# Patient Record
Sex: Male | Born: 1980 | Race: White | Hispanic: No | Marital: Married | Smoking: Current every day smoker
Health system: Southern US, Community
[De-identification: ages and names within clinical notes are randomized; demographics above are authoritative.]

## PROBLEM LIST (undated history)

## (undated) DIAGNOSIS — M069 Rheumatoid arthritis, unspecified: Secondary | ICD-10-CM

## (undated) DIAGNOSIS — I10 Essential (primary) hypertension: Secondary | ICD-10-CM

## (undated) HISTORY — DX: Essential (primary) hypertension: I10

## (undated) HISTORY — PX: CYST EXCISION: SHX5701

---

## 2015-05-21 ENCOUNTER — Encounter (HOSPITAL_COMMUNITY): Payer: Self-pay | Admitting: *Deleted

## 2015-05-21 ENCOUNTER — Emergency Department (HOSPITAL_COMMUNITY)
Admission: EM | Admit: 2015-05-21 | Discharge: 2015-05-21 | Disposition: A | Discharge status: Home / Self Care | Payer: Self-pay | Attending: Emergency Medicine | Admitting: Emergency Medicine

## 2015-05-21 ENCOUNTER — Emergency Department (HOSPITAL_COMMUNITY): Payer: Self-pay

## 2015-05-21 DIAGNOSIS — Z720 Tobacco use: Secondary | ICD-10-CM | POA: Insufficient documentation

## 2015-05-21 DIAGNOSIS — Y998 Other external cause status: Secondary | ICD-10-CM | POA: Insufficient documentation

## 2015-05-21 DIAGNOSIS — Y9389 Activity, other specified: Secondary | ICD-10-CM | POA: Insufficient documentation

## 2015-05-21 DIAGNOSIS — F419 Anxiety disorder, unspecified: Secondary | ICD-10-CM | POA: Insufficient documentation

## 2015-05-21 DIAGNOSIS — Y9289 Other specified places as the place of occurrence of the external cause: Secondary | ICD-10-CM | POA: Insufficient documentation

## 2015-05-21 DIAGNOSIS — T401X1A Poisoning by heroin, accidental (unintentional), initial encounter: Secondary | ICD-10-CM | POA: Insufficient documentation

## 2015-05-21 MED ORDER — NALOXONE HCL 0.4 MG/ML IJ SOLN
0.4000 mg | Freq: Once | INTRAMUSCULAR | Status: AC
  Administered 2015-05-21: 0.4 mg via INTRAVENOUS
  Filled 2015-05-21: qty 1

## 2015-05-21 NOTE — ED Notes (Signed)
Pt awakens to verbal stimuli.  He denies pain at this time.  No acute distress.  RN offered toileting and pt denied the need to void.

## 2015-05-21 NOTE — ED Notes (Signed)
Pt is still sleeping with equal respirations and equal chest rise and fall/  No acute distress noted.

## 2015-05-21 NOTE — ED Provider Notes (Signed)
CSN: [id number]     Arrival date & time 05/21/15  1334 History   First MD Initiated Contact with Patient 05/21/15 1353     Chief Complaint  Patient presents with  . Drug Overdose    heroin     (Consider location/radiation/quality/duration/timing/severity/associated sxs/prior Treatment) HPI Comments: Patient presents to the emergency department after apparent heroin overdose. Patient was found unconscious at a gas station. Patient extremely somnolent, having difficulty staying awake at arrival. Level V Caveat due to somnolence.   History reviewed. No pertinent past medical history. History reviewed. No pertinent past surgical history. History reviewed. No pertinent family history. Social History  Substance Use Topics  . Smoking status: Smoker, Current Status Unknown  . Smokeless tobacco: None  . Alcohol Use: None    Review of Systems  Unable to perform ROS: Mental status change      Allergies  Review of patient's allergies indicates no known allergies.  Home Medications   Prior to Admission medications   Not on File   BP 95/42 mmHg  Pulse 104  Temp(Src) 99 F (37.2 C) (Oral)  Resp 10  SpO2 100% Physical Exam  Constitutional: He appears well-developed and well-nourished. He appears lethargic. No distress.  HENT:  Head: Normocephalic and atraumatic.  Right Ear: Hearing normal.  Left Ear: Hearing normal.  Nose: Nose normal.  Mouth/Throat: Oropharynx is clear and moist and mucous membranes are normal.  Eyes: Conjunctivae and EOM are normal. Pupils are equal, round, and reactive to light.  Neck: Normal range of motion. Neck supple.  Cardiovascular: Regular rhythm, S1 normal and S2 normal.  Exam reveals no gallop and no friction rub.   No murmur heard. Pulmonary/Chest: Effort normal and breath sounds normal. No respiratory distress. He exhibits no tenderness.  Abdominal: Soft. Normal appearance and bowel sounds are normal. There is no hepatosplenomegaly. There is no  tenderness. There is no rebound, no guarding, no tenderness at McBurney's point and negative Murphy's sign. No hernia.  Musculoskeletal: Normal range of motion.  Neurological: He has normal strength. He appears lethargic. No cranial nerve deficit or sensory deficit. Coordination normal.  Skin: Skin is warm, dry and intact. No rash noted. No cyanosis.  Psychiatric: His mood appears anxious.  Nursing note and vitals reviewed.   ED Course  Procedures (including critical care time) Labs Review Labs Reviewed - No data to display  Imaging Review Dg Chest [facility] 1 View  05/21/2015   CLINICAL DATA:  Heroin overdose  EXAM: PORTABLE CHEST - 1 VIEW  COMPARISON:  None.  FINDINGS: Lungs are clear. Heart size and pulmonary vascularity are normal. No adenopathy. No pneumothorax. No bone lesions.  IMPRESSION: No abnormality noted.   Electronically Signed   By: Bretta Bang III M.D.   On: 05/21/2015 14:34   I, POLLINA, CHRISTOPHER J., personally reviewed and evaluated these images and lab results as part of my medical decision-making.   EKG Interpretation   Date/Time:  Monday May 21 2015 14:09:26 EDT Ventricular Rate:  114 PR Interval:  137 QRS Duration: 94 QT Interval:  337 QTC Calculation: 464 R Axis:   78 Text Interpretation:  Age not entered, assumed to be  34 years old for  purpose of ECG interpretation Sinus tachycardia Otherwise within normal  limits Confirmed by POLLINA  MD, CHRISTOPHER [phone]) on 05/21/2015 2:59:44  PM      MDM   Final diagnoses:  Heroin overdose, accidental or unintentional, initial encounter    Patient presented to the ER very somnolent. Heroin  overdose was suspected. Patient had not been given any intervention by EMS. He was given Narcan 0.4 mg IV here in the ER became more awake and alert. He admits to using heroin earlier. Overdose was unintentional. Patient will be monitored here in the ER, anticipate discharge.    Gilda Crease, MD 05/21/15  [phone]

## 2015-05-21 NOTE — ED Notes (Signed)
Pt continues to sleep comfortably with equal chest rise and fall.  O2 saturation 99% on room air. No acute distress noted.

## 2015-05-21 NOTE — ED Notes (Signed)
Pt more awake.  Water given.  Pt states "he wants to get this stuff off of him and go home".

## 2015-05-21 NOTE — ED Notes (Signed)
Pt continues to sleep comfortably in bed.  Equal chest rise and fall noted.  No acute distress at this time.  O2 saturation 98% on room air.

## 2015-05-21 NOTE — ED Notes (Signed)
Pt found at gas station, responsive only to painful stimuli. RR 6 upon arrival

## 2015-05-21 NOTE — ED Notes (Signed)
Bed: WU98 Expected date:  Expected time:  Means of arrival:  Comments: EMS- 34yo M, heroin OD

## 2015-05-21 NOTE — Discharge Instructions (Signed)
Narcotic Overdose °A narcotic overdose is the misuse or overuse of a narcotic drug. A narcotic overdose can make you pass out and stop breathing. If you are not treated right away, this can cause permanent brain damage or stop your heart. Medicine may be given to reverse the effects of an overdose. If so, this medicine may bring on withdrawal symptoms. The symptoms may be abdominal cramps, throwing up (vomiting), sweating, chills, and nervousness. °Injecting narcotics can cause more problems than just an overdose. AIDS, hepatitis, and other very serious infections are transmitted by sharing needles and syringes. If you decide to quit using, there are medicines which can help you through the withdrawal period. Trying to quit all at once on your own can be uncomfortable, but not life-threatening. Call your caregiver, Narcotics Anonymous, or any drug and alcohol treatment program for further help.  °Document Released: 10/30/2004 Document Revised: 12/15/2011 Document Reviewed: 08/24/2009 °ExitCare® Patient Information ©2015 [employer]. This information is not intended to replace advice given to you by your health care provider. Make sure you discuss any questions you have with your health care provider. °Substance Abuse Treatment Programs ° °Intensive Outpatient Programs °[facility]     °[address]      °[place], [place]                   °[phone]      ° °[facility] °[address] °[place], [place] °[phone] ° °[facility]     °(Inpatient and outpatient)     °[address]           °[phone]   ° °[facility] °[phone] (Suboxone and Methadone) ° °[address]      °[place], [place] [address]      °[phone]      ° °[address] °[place], [place] °852-3033 ° °[facility] (Outpatient/Inpatient, Chemical)    °(insurance only) [phone]      °       °[facility] (Groups &  Residential) °[place], [place] °[phone] ° °   °[facility]     °[address]     °[place], [place]      °[phone]      ° °[facility] (for caregivers and family) °[address] °[place], [place] °336-299-4655 ° ° ° ° ° °Residential Treatment Programs °[facility]      °[address], [place], [place] [address]  °[phone]      ° °[facility] °[address] [place], [place] [address] °[phone] ° °[facility]        °336-248-8914      ° °[facility] °[phone] ° °[facility]             °[address]                                         °[place], [place]                                                °[phone] or [phone]                              ° °  [facility] [place] [address] [place], [address] [phone]  [facility]    [address]      [place], [place]     [phone]       [facility] [address] [place], [place] [phone]  [facility]   [address]     [place], [place] [address]     [phone]      Admissions: 8am-3pm M-F  [facility] ([facility]) [address] [place], [place] [phone]  [facility]: Residential Program [phone](90 Days)   [place], [place]      [phone] or [phone]     [facility]: [facility] [place], [place] (Walk in Hours over the weekend or by referral)  [facility] [address] [place], [place], [place] [address] [phone]  Crisis Mobile: [facility]:  [phone] (for crisis response 24 hours a day) [facility] Hotline:      [phone] Outpatient Psychiatry and Counseling  [facility]: Mobile Crisis Management 24 hours:  [phone]  [facility] sliding scale fee and walk in schedule: M-F 8am-12pm/1pm-3pm [address]  [place], [place]  [address] [phone]  [facility] [address] [place], [place] [address] [phone]  [facility] ([facility] new patient walk-in appointments available Monday - Friday 8am -3pm.          [address] [place], [place] [address] [phone] or crisis line- [phone]  [facility]/ Intensive Outpatient Therapy Program [address] [place], [place] [address] [phone]  [facility]                  Crisis Services      [phone]      [address]     [place], [place] [address]                 [facility]   [facility] [phone] [address] [place], [place] [address]   [facility]          [address] [place],  [place], [place] [address]       [phone]  [facility] [address] [place], [place] [address] [phone]  [facility]    [address]    [place], [place] [address]     [phone]       Andee PolesParish McKinney, MD     [address]     [place] [address]     [phone]       [facility] [address] [place] [address]  [facility]     [address]     [place], [place]      [phone]       [facility] Eulogio DitchShamsher Ahluwalia, MD [address] [place], [place] [address] [phone]  [facility]     [address]     [place], [place] [address]     [phone]       [facility] [address] [place], [place] [address] [phone] Resources for Temporary Residential Assistance/Crisis Centers  [facility]) M-F 8am-3pm   [address] [place], [place] [address]   [phone] Services include: laundry, barbering, support groups, case management, phone  &  computer access,  showers, AA/NA mtgs, mental health/substance abuse nurse, job skills class, disability information, VA assistance, spiritual classes, etc.   HOMELESS SHELTERS  [facility]     [facility]   [address], [place]     [phone]              [facility] [address] (women and children)       [address] [place], [place] [address] [phone] [email or web address] for application and process Application Required  [facility]   [address]    [place] [address]     [phone]                    [facility] [address] [place], [place] [address] [phone](schedule application appt.) Application Required  [facility] (women only)    [address]     [place], [place] [address]     [phone]      Intake starts 6pm daily Need valid ID, SSC, & Police report [facility] [place] [address] [place], [place] [phone] Application Required  [facility] (men only)     [address]      [place], [place]     [phone]       [facility] (Pregnant women only) [address] [place], [place] [phone]  [facility]      [address]      [place], [place] [address]     [phone]             [facility] [address] [place], [place] [phone] 90 day commitment/SA/Application process  [facility](men only)     [address]     [place], [place]     [phone]       Check-in at [facility]pm            [facility] [address] [place], [place] [address] [phone] Men/Women/Women and Children must be there by 7 pm  [facility] [place], [place] [phone]

## 2015-05-21 NOTE — ED Provider Notes (Signed)
11:32 PM Assumed care from Dr. Blinda Leatherwood, please see their note for full history, physical and decision making until this point. In brief this is a 34 y.o. year old male who presented to the ED tonight with Drug Overdose     Heroin overdose, allow to sober up for partially 8 hours. Patient subsequently with normal vital signs, alert and oriented and stable for discharge.  Discharge instructions, including strict return precautions for new or worsening symptoms, given. Patient and/or family verbalized understanding and agreement with the plan as described.    Marily Memos, MD 05/21/15 [phone]

## 2016-10-04 IMAGING — CR DG CHEST 1V PORT
1 series · 1 of 1 positions shown · non-contrast
Comparison: None.

CLINICAL DATA: Heroin overdose

EXAM:
PORTABLE CHEST - 1 VIEW

[AP]
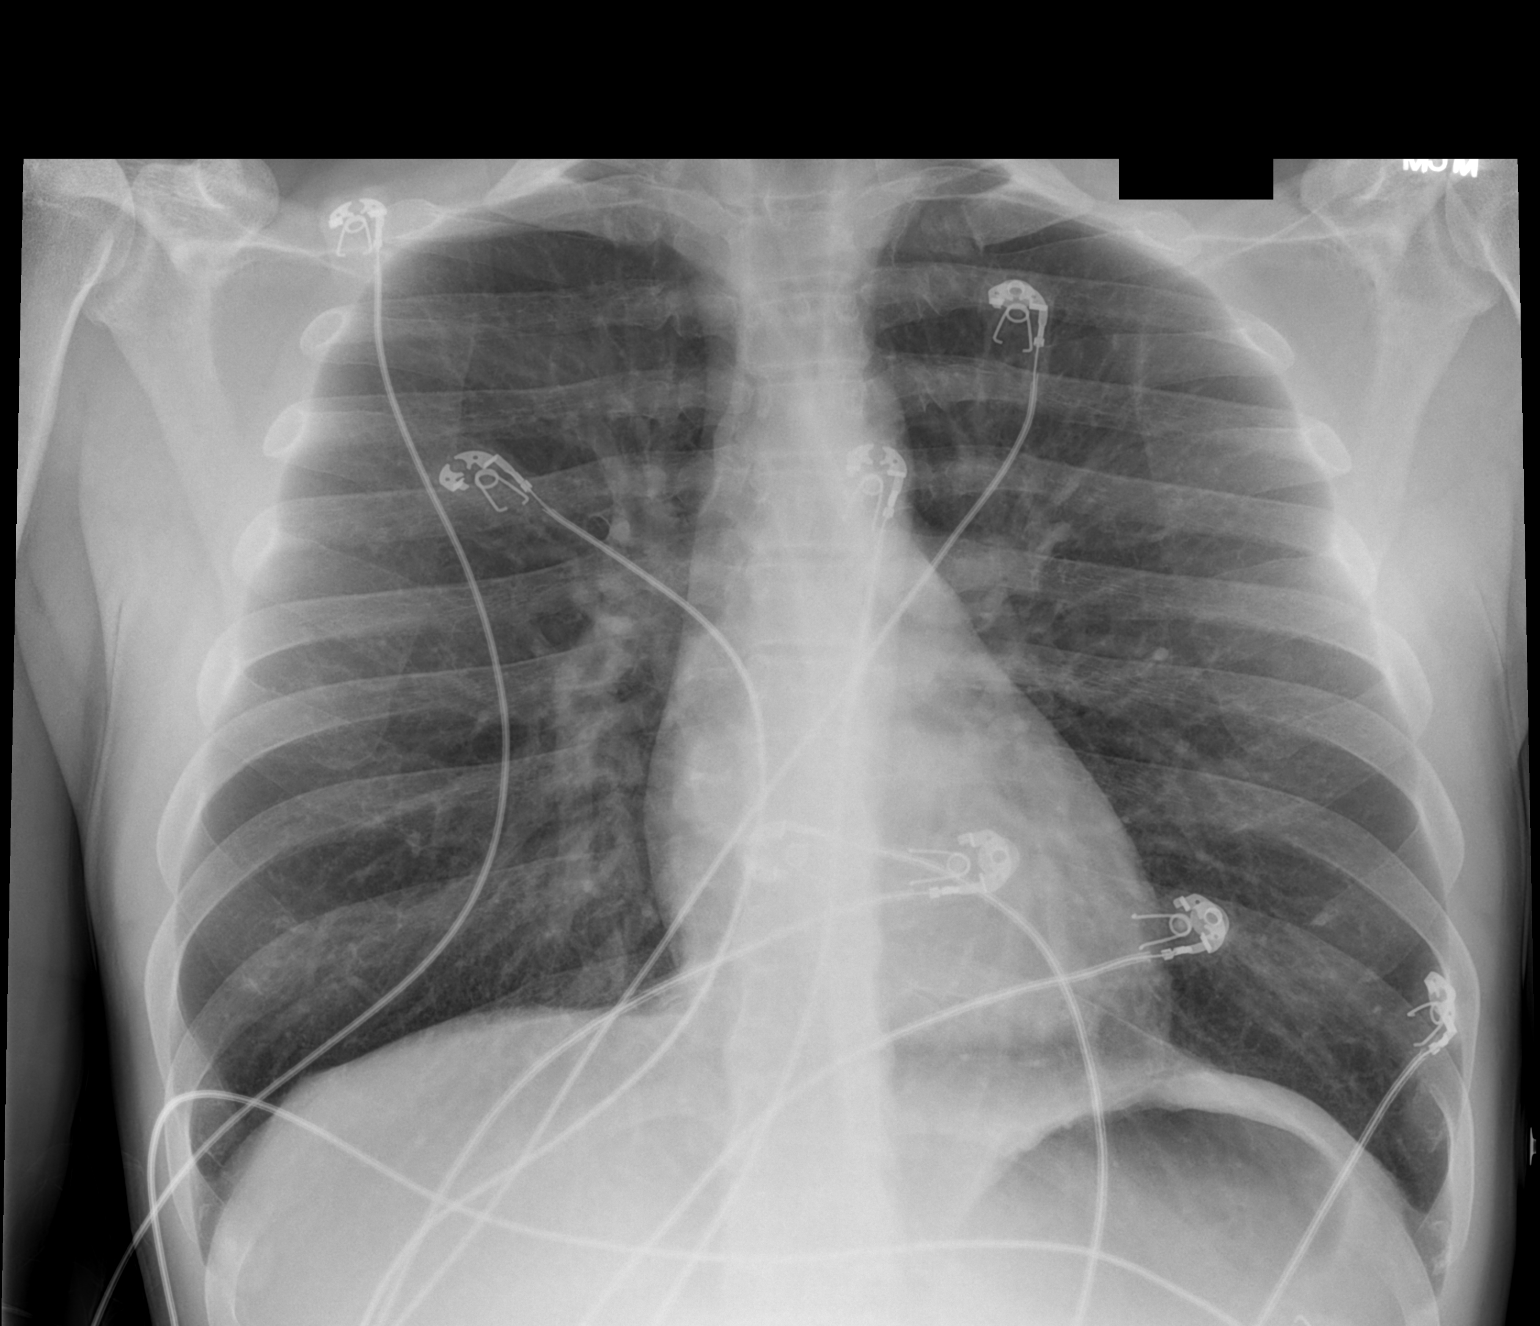

[1 of 1 positions shown; findings below may reference images not displayed]

FINDINGS: Lungs are clear. Heart size and pulmonary vascularity are normal. No
adenopathy. No pneumothorax. No bone lesions.
IMPRESSION: No abnormality noted.

## 2019-12-01 ENCOUNTER — Ambulatory Visit (INDEPENDENT_AMBULATORY_CARE_PROVIDER_SITE_OTHER): Payer: Self-pay | Admitting: Internal Medicine

## 2019-12-21 ENCOUNTER — Ambulatory Visit (INDEPENDENT_AMBULATORY_CARE_PROVIDER_SITE_OTHER): Payer: 59 | Admitting: Internal Medicine

## 2019-12-21 ENCOUNTER — Other Ambulatory Visit: Payer: Self-pay

## 2019-12-21 ENCOUNTER — Encounter (INDEPENDENT_AMBULATORY_CARE_PROVIDER_SITE_OTHER): Payer: Self-pay | Admitting: Internal Medicine

## 2019-12-21 VITALS — BP 130/70 | HR 89 | Temp 98.4°F | Resp 18 | Ht 73.0 in | Wt 209.0 lb

## 2019-12-21 DIAGNOSIS — Z125 Encounter for screening for malignant neoplasm of prostate: Secondary | ICD-10-CM

## 2019-12-21 DIAGNOSIS — R7989 Other specified abnormal findings of blood chemistry: Secondary | ICD-10-CM | POA: Diagnosis not present

## 2019-12-21 DIAGNOSIS — E559 Vitamin D deficiency, unspecified: Secondary | ICD-10-CM | POA: Diagnosis not present

## 2019-12-21 DIAGNOSIS — Z13220 Encounter for screening for lipoid disorders: Secondary | ICD-10-CM

## 2019-12-21 DIAGNOSIS — Z131 Encounter for screening for diabetes mellitus: Secondary | ICD-10-CM

## 2019-12-21 DIAGNOSIS — R5383 Other fatigue: Secondary | ICD-10-CM

## 2019-12-21 DIAGNOSIS — R5381 Other malaise: Secondary | ICD-10-CM | POA: Diagnosis not present

## 2019-12-21 NOTE — Progress Notes (Signed)
Metrics: Intervention Frequency ACO  Documented Smoking Status Yearly  Screened one or more times in 24 months  Cessation Counseling or  Active cessation medication Past 24 months  Past 24 months   Guideline developer: UpToDate (See UpToDate for funding source) Date Released: 2014       Wellness Office Visit  Subjective:  Patient ID: Marvin Mcmahon, male    DOB: [birth year]  Age: 39 y.o. MRN: [id number]  CC: This 39 year old man comes in as a new patient to be established. HPI  He has a history of low testosterone levels and sees another practice for testosterone therapy.  He wishes to switch to this practice. He currently takes testosterone, once a week, clomiphene twice a week and also Arimidex once a week. He tells me that testosterone therapy has made him feel better in terms of more energy but he still could improve this in his opinion.  He still feels somewhat fatigued. History reviewed. No pertinent past medical history.    Family History  Problem Relation Age of Onset  . Hypertension Mother   . Arthritis Father     Social History   Social History Narrative   Married for 6 years.Lives with wife,step son,father-in-law.Works for [employer].   Social History   Tobacco Use  . Smoking status: Smoker, Current Status Unknown    Packs/day: 3.00    Types: Cigarettes  . Smokeless tobacco: Never Used  . Tobacco comment: a pack every 3 days.  Substance Use Topics  . Alcohol use: Not Currently    Current Meds  Medication Sig  . Testosterone Cypionate 200 MG/ML SOLN Inject 120 mg as directed once a week.      Objective:   Today's Vitals: BP 130/70 (BP Location: Right Arm, Patient Position: Sitting, Cuff Size: Normal)   Pulse 89   Temp 98.4 F (36.9 C) (Temporal)   Resp 18   Ht 6\' 1"  (1.854 m)   Wt 209 lb (94.8 kg)   BMI 27.57 kg/m  Vitals with BMI 12/21/2019 05/21/2015 05/21/2015  Height 6\' 1"  - -  Weight 209 lbs - -  BMI 27.58 - -    Systolic 130 92 94  Diastolic 70 48 50  Pulse 89 68 69     Physical Exam   He looks systemically well.  Somewhat overweight.  He is alert and orientated.    Assessment   1. Low testosterone in male   2. Malaise and fatigue   3. Vitamin D deficiency disease   4. Screening for diabetes mellitus   5. Screening for lipoid disorders   6. Special screening for malignant neoplasm of prostate       Tests ordered Orders Placed This Encounter  Procedures  . COMPLETE METABOLIC PANEL WITH GFR  . CBC  . Testosterone Total,Free,Bio, Males  . VITAMIN D 25 Hydroxy (Vit-D Deficiency, Fractures)  . Hemoglobin A1c  . Lipid panel  . PSA  . T3, free  . T4  . TSH     Plan: 1. Blood work is ordered above. 2. I recommended that he discontinue clomiphene and Arimidex and he will continue with testosterone therapy and explained the rationale for this. 3. I will see him in the next 2 to 3 weeks time to discuss all his blood results in detail and do an annual physical exam. 4. Today I spent 40 minutes with the patient.   No orders of the defined types were placed in this encounter.   05/23/2015, MD

## 2019-12-22 ENCOUNTER — Ambulatory Visit (INDEPENDENT_AMBULATORY_CARE_PROVIDER_SITE_OTHER): Payer: Self-pay | Admitting: Internal Medicine

## 2019-12-22 LAB — COMPLETE METABOLIC PANEL WITH GFR
AG Ratio: 2.2 (calc) (ref 1.0–2.5)
ALT: 30 U/L (ref 9–46)
AST: 43 U/L — ABNORMAL HIGH (ref 10–40)
Albumin: 4.8 g/dL (ref 3.6–5.1)
Alkaline phosphatase (APISO): 80 U/L (ref 36–130)
BUN: 21 mg/dL (ref 7–25)
CO2: 25 mmol/L (ref 20–32)
Calcium: 9.9 mg/dL (ref 8.6–10.3)
Chloride: 105 mmol/L (ref 98–110)
Creat: 1.04 mg/dL (ref 0.60–1.35)
GFR, Est African American: 106 mL/min/{1.73_m2} (ref >=60)
GFR, Est Non African American: 91 mL/min/{1.73_m2} (ref >=60)
Globulin: 2.2 g/dL (calc) (ref 1.9–3.7)
Glucose, Bld: 90 mg/dL (ref 65–99)
Potassium: 4.1 mmol/L (ref 3.5–5.3)
Sodium: 140 mmol/L (ref 135–146)
Total Bilirubin: 0.3 mg/dL (ref 0.2–1.2)
Total Protein: 7 g/dL (ref 6.1–8.1)

## 2019-12-22 LAB — CBC
HCT: 52.8 % — ABNORMAL HIGH (ref 38.5–50.0)
Hemoglobin: 17.6 g/dL — ABNORMAL HIGH (ref 13.2–17.1)
MCH: 30.4 pg (ref 27.0–33.0)
MCHC: 33.3 g/dL (ref 32.0–36.0)
MCV: 91.3 fL (ref 80.0–100.0)
MPV: 11.1 fL (ref 7.5–12.5)
Platelets: 239 10*3/uL (ref 140–400)
RBC: 5.78 10*6/uL (ref 4.20–5.80)
RDW: 11.9 % (ref 11.0–15.0)
WBC: 8.1 10*3/uL (ref 3.8–10.8)

## 2019-12-22 LAB — TESTOSTERONE TOTAL,FREE,BIO, MALES
Albumin: 4.8 g/dL (ref 3.6–5.1)
Sex Hormone Binding: 58 nmol/L — ABNORMAL HIGH (ref 10–50)
Testosterone, Bioavailable: 308.1 ng/dL (ref >=110.0)
Testosterone, Free: 140.9 pg/mL (ref 46.0–224.0)
Testosterone: 1406 ng/dL — ABNORMAL HIGH (ref 250–827)

## 2019-12-22 LAB — LIPID PANEL
Cholesterol: 203 mg/dL — ABNORMAL HIGH (ref <200)
HDL: 51 mg/dL (ref >=40)
LDL Cholesterol (Calc): 133 mg/dL (calc) — ABNORMAL HIGH
Non-HDL Cholesterol (Calc): 152 mg/dL (calc) — ABNORMAL HIGH (ref <130)
Total CHOL/HDL Ratio: 4 (calc) (ref <5.0)
Triglycerides: 93 mg/dL (ref <150)

## 2019-12-22 LAB — VITAMIN D 25 HYDROXY (VIT D DEFICIENCY, FRACTURES): Vit D, 25-Hydroxy: 36 ng/mL (ref 30–100)

## 2019-12-22 LAB — T4: T4, Total: 9 ug/dL (ref 4.9–10.5)

## 2019-12-22 LAB — T3, FREE: T3, Free: 3.2 pg/mL (ref 2.3–4.2)

## 2019-12-22 LAB — HEMOGLOBIN A1C
Hgb A1c MFr Bld: 4.9 % of total Hgb (ref <5.7)
Mean Plasma Glucose: 94 (calc)
eAG (mmol/L): 5.2 (calc)

## 2019-12-22 LAB — TSH: TSH: 1.92 mIU/L (ref 0.40–4.50)

## 2019-12-22 LAB — PSA: PSA: 0.3 ng/mL (ref <=4.0)

## 2020-01-23 ENCOUNTER — Other Ambulatory Visit (INDEPENDENT_AMBULATORY_CARE_PROVIDER_SITE_OTHER): Payer: Self-pay | Admitting: Internal Medicine

## 2020-01-23 ENCOUNTER — Telehealth (INDEPENDENT_AMBULATORY_CARE_PROVIDER_SITE_OTHER): Payer: Self-pay | Admitting: Internal Medicine

## 2020-01-23 MED ORDER — TESTOSTERONE CYPIONATE 200 MG/ML IJ SOLN: 120.0000 mg | INTRAMUSCULAR | 2 refills | Status: DC

## 2020-01-24 NOTE — Telephone Encounter (Signed)
Done

## 2020-01-30 ENCOUNTER — Other Ambulatory Visit: Payer: Self-pay

## 2020-01-30 ENCOUNTER — Ambulatory Visit (INDEPENDENT_AMBULATORY_CARE_PROVIDER_SITE_OTHER): Payer: 59 | Admitting: Internal Medicine

## 2020-01-30 ENCOUNTER — Encounter (INDEPENDENT_AMBULATORY_CARE_PROVIDER_SITE_OTHER): Payer: Self-pay | Admitting: Internal Medicine

## 2020-01-30 VITALS — BP 120/65 | HR 91 | Temp 98.4°F | Ht 73.0 in | Wt 204.8 lb

## 2020-01-30 DIAGNOSIS — R7989 Other specified abnormal findings of blood chemistry: Secondary | ICD-10-CM

## 2020-01-30 DIAGNOSIS — E559 Vitamin D deficiency, unspecified: Secondary | ICD-10-CM

## 2020-01-30 DIAGNOSIS — Z0000 Encounter for general adult medical examination without abnormal findings: Secondary | ICD-10-CM | POA: Diagnosis not present

## 2020-01-30 NOTE — Progress Notes (Signed)
Chief Complaint: This 39 year old man comes in for an annual physical exam. HPI: He takes testosterone therapy for low testosterone levels in the past and he was seeing another practice regarding this.  When I saw him the last visit, I recommended he discontinue Arimidex, other medications he was taking in addition to testosterone and to continue only with testosterone which he has been doing at a dose of 0.6 mL once a week. He tells me that when he takes this dose, he feels well most of the week but for the last 3 days of the week, he feels that his energy levels are waning. I reviewed all his blood work with him today.  He has vitamin D deficiency in my opinion.  His testosterone levels are good but this was 1 day post injection and his free testosterone levels could be better.  History reviewed. No pertinent past medical history. History reviewed. No pertinent surgical history.   Social History   Social History Narrative   Married for 6 years.Lives with wife,step son,father-in-law.Works for [employer].    Social History   Tobacco Use  . Smoking status: Smoker, Current Status Unknown    Packs/day: 3.00    Types: Cigarettes  . Smokeless tobacco: Never Used  . Tobacco comment: a pack every 3 days.  Substance Use Topics  . Alcohol use: Not Currently      Allergies: No Known Allergies   Current Meds  Medication Sig  . Testosterone Cypionate 200 MG/ML SOLN Inject 120 mg as directed once a week.      ZOX:WRUEA from the symptoms mentioned above,there are no other symptoms referable to all systems reviewed.  Physical Exam: Blood pressure 120/65, pulse 91, temperature 98.4 F (36.9 C), temperature source Temporal, height 6\' 1"  (1.854 m), weight 204 lb 12.8 oz (92.9 kg), SpO2 98 %. Vitals with BMI 01/30/2020 12/21/2019 05/21/2015  Height 6\' 1"  6\' 1"  -  Weight 204 lbs 13 oz 209 lbs -  BMI 54.09 81.19 -  Systolic 147 829 92  Diastolic 65 70 48  Pulse  91 89 68      He looks systemically well. General: Alert, cooperative, and appears to be the stated age.No pallor.  No jaundice.  No clubbing. Head: Normocephalic Eyes: Sclera white, pupils equal and reactive to light, red reflex x 2,  Ears: Normal bilaterally Oral cavity: Lips, mucosa, and tongue normal: Teeth and gums normal Neck: No adenopathy, supple, symmetrical, trachea midline, and thyroid does not appear enlarged Respiratory: Clear to auscultation bilaterally.No wheezing, crackles or bronchial breathing. Cardiovascular: Heart sounds are present and appear to be normal without murmurs or added sounds.  No carotid bruits.  Peripheral pulses are present and equal bilaterally.: Gastrointestinal:positive bowel sounds, no hepatosplenomegaly.  No masses felt.No tenderness. Skin: Clear, No rashes noted.No worrisome skin lesions seen. Neurological: Grossly intact without focal findings, cranial nerves II through XII intact, muscle strength equal bilaterally Musculoskeletal: No acute joint abnormalities noted.Full range of movement noted with joints. Psychiatric: Affect appropriate, non-anxious.    Assessment  1. Routine general medical examination at a health care facility   2. Vitamin D deficiency disease   3. Low testosterone in male     Tests Ordered:  No orders of the defined types were placed in this encounter.    Plan  1. This is a healthy 39 year old man. 2. We discussed COVID-19 vaccination and I have encouraged him to receive this vaccination sooner rather than later. 3. We discussed smoking cessation also  briefly. 4. I recommended he start taking vitamin D3 10,000 units daily. 5. I recommended that he increase the testosterone so that he is taking 0.6 mL twice a week and I think this will achieve good results for him. 6. I will see him in about 3 months time to see how he is doing and we will check levels again. 7. Today, in addition to a preventative visit, I  performed an office visit to address this vitamin D deficiency and symptoms regarding testosterone.     No orders of the defined types were placed in this encounter.    Shahira Fiske C Marquet [place]   01/30/2020, 4:35 PM

## 2020-01-30 NOTE — Patient Instructions (Signed)
[facility] Dietary Recommendations for Weight Loss What to Avoid . Avoid added sugars o Often added sugar can be found in processed foods such as many condiments, dry cereals, cakes, cookies, chips, crisps, crackers, candies, sweetened drinks, etc.  o Read labels and AVOID/DECREASE use of foods with the following in their ingredient list: Sugar, fructose, high fructose corn syrup, sucrose, glucose, maltose, dextrose, molasses, cane sugar, brown sugar, any type of syrup, agave nectar, etc.   . Avoid snacking in between meals . Avoid foods made with flour o If you are going to eat food made with flour, choose those made with whole-grains; and, minimize your consumption as much as is tolerable . Avoid processed foods o These foods are generally stocked in the middle of the grocery store. Focus on shopping on the perimeter of the grocery.  . Avoid Meat  o We recommend following a plant-based diet at [facility]. Thus, we recommend avoiding meat as a general rule. Consider eating beans, legumes, eggs, and/or dairy products for regular protein sources o If you plan on eating meat limit to 4 ounces of meat at a time and choose lean options such as Fish, chicken, turkey. Avoid red meat intake such as pork and/or steak What to Include . Vegetables o GREEN LEAFY VEGETABLES: Kale, spinach, mustard greens, collard greens, cabbage, broccoli, etc. o OTHER: Asparagus, cauliflower, eggplant, carrots, peas, Brussel sprouts, tomatoes, bell peppers, zucchini, beets, cucumbers, etc. . Grains, seeds, and legumes o Beans: kidney beans, black eyed peas, garbanzo beans, black beans, pinto beans, etc. o Whole, unrefined grains: brown rice, barley, bulgur, oatmeal, etc. . Healthy fats  o Avoid highly processed fats such as vegetable oil o Examples of healthy fats: avocado, olives, virgin olive oil, dark chocolate (?72% Cocoa), nuts (peanuts, almonds, walnuts, cashews, pecans, etc.) . None to Low  Intake of Animal Sources of Protein o Meat sources: chicken, turkey, salmon, tuna. Limit to 4 ounces of meat at one time. o Consider limiting dairy sources, but when choosing dairy focus on: PLAIN Greek yogurt, cottage cheese, high-protein milk . Fruit o Choose berries  When to Eat . Intermittent Fasting: o Choosing not to eat for a specific time period, but DO FOCUS ON HYDRATION when fasting o Multiple Techniques: - Time Restricted Eating: eat 3 meals in a day, each meal lasting no more than 60 minutes, no snacks between meals - 16-18 hour fast: fast for 16 to 18 hours up to 7 days a week. Often suggested to start with 2-3 nonconsecutive days per week.  . Remember the time you sleep is counted as fasting.  . Examples of eating schedule: Fast from 7:00pm-11:00am. Eat between 11:00am-7:00pm.  - 24-hour fast: fast for 24 hours up to every other day. Often suggested to start with 1 day per week . Remember the time you sleep is counted as fasting . Examples of eating schedule:  o Eating day: eat 2-3 meals on your eating day. If doing 2 meals, each meal should last no more than 90 minutes. If doing 3 meals, each meal should last no more than 60 minutes. Finish last meal by 7:00pm. o Fasting day: Fast until 7:00pm.  o IF YOU FEEL UNWELL FOR ANY REASON/IN ANY WAY WHEN FASTING, STOP FASTING BY EATING A NUTRITIOUS SNACK OR LIGHT MEAL o ALWAYS FOCUS ON HYDRATION DURING FASTS - Acceptable Hydration sources: water, broths, tea/coffee (black tea/coffee is best but using a small amount of whole-fat dairy products in coffee/tea is acceptable).  -   Poor Hydration Sources: anything with sugar or artificial sweeteners added to it  These recommendations have been developed for patients that are actively receiving medical care from either Dr. Lenda Baratta or Sarah Gray, DNP, NP-C at [facility]. These recommendations are developed for patients with specific medical conditions and are not meant to be  distributed or used by others that are not actively receiving care from either provider listed above at [facility]. It is not appropriate to participate in the above eating plans without proper medical supervision.   Reference: Fung, J. The obesity code. [place]: [place]; 2016.   

## 2020-03-06 ENCOUNTER — Other Ambulatory Visit (INDEPENDENT_AMBULATORY_CARE_PROVIDER_SITE_OTHER): Payer: Self-pay

## 2020-03-06 MED ORDER — TESTOSTERONE CYPIONATE 200 MG/ML IJ SOLN: 120.0000 mg | INTRAMUSCULAR | 2 refills | Status: DC

## 2020-03-20 ENCOUNTER — Other Ambulatory Visit (INDEPENDENT_AMBULATORY_CARE_PROVIDER_SITE_OTHER): Payer: Self-pay

## 2020-03-20 MED ORDER — TESTOSTERONE CYPIONATE 200 MG/ML IJ SOLN: 120.0000 mg | INTRAMUSCULAR | 2 refills | Status: DC

## 2020-03-21 ENCOUNTER — Other Ambulatory Visit (INDEPENDENT_AMBULATORY_CARE_PROVIDER_SITE_OTHER): Payer: Self-pay | Admitting: Internal Medicine

## 2020-03-21 ENCOUNTER — Telehealth (INDEPENDENT_AMBULATORY_CARE_PROVIDER_SITE_OTHER): Payer: Self-pay

## 2020-03-21 MED ORDER — TESTOSTERONE CYPIONATE 200 MG/ML IJ SOLN: 120.0000 mg | INTRAMUSCULAR | 2 refills | Status: DC

## 2020-03-21 NOTE — Telephone Encounter (Signed)
Okay, I have now sent it to [facility] in [place].

## 2020-03-21 NOTE — Telephone Encounter (Signed)
Thank you Ill let patient know

## 2020-03-21 NOTE — Telephone Encounter (Signed)
Dr. Belva Chimes is stating his Testosterone went to [facility] in [place] and it needs to be sent to [facility] in [place]

## 2020-04-30 ENCOUNTER — Ambulatory Visit (INDEPENDENT_AMBULATORY_CARE_PROVIDER_SITE_OTHER): Payer: 59 | Admitting: Internal Medicine

## 2020-05-23 ENCOUNTER — Other Ambulatory Visit: Payer: Self-pay

## 2020-05-23 ENCOUNTER — Encounter (INDEPENDENT_AMBULATORY_CARE_PROVIDER_SITE_OTHER): Payer: Self-pay

## 2020-05-23 ENCOUNTER — Encounter (INDEPENDENT_AMBULATORY_CARE_PROVIDER_SITE_OTHER): Payer: Self-pay | Admitting: Internal Medicine

## 2020-05-23 ENCOUNTER — Ambulatory Visit (INDEPENDENT_AMBULATORY_CARE_PROVIDER_SITE_OTHER): Payer: 59 | Admitting: Internal Medicine

## 2020-05-23 VITALS — BP 122/70 | HR 82 | Temp 97.6°F | Resp 17 | Ht 73.0 in | Wt 206.0 lb

## 2020-05-23 DIAGNOSIS — E559 Vitamin D deficiency, unspecified: Secondary | ICD-10-CM | POA: Diagnosis not present

## 2020-05-23 DIAGNOSIS — R7989 Other specified abnormal findings of blood chemistry: Secondary | ICD-10-CM | POA: Diagnosis not present

## 2020-05-23 DIAGNOSIS — R748 Abnormal levels of other serum enzymes: Secondary | ICD-10-CM

## 2020-05-23 NOTE — Progress Notes (Signed)
Metrics: Intervention Frequency ACO  Documented Smoking Status Yearly  Screened one or more times in 24 months  Cessation Counseling or  Active cessation medication Past 24 months  Past 24 months   Guideline developer: UpToDate (See UpToDate for funding source) Date Released: 2014       Wellness Office Visit  Subjective:  Patient ID: Marvin Mcmahon, male    DOB: [birth year]  Age: 39 y.o. MRN: [id number]  CC: This man comes in for follow-up of testosterone therapy, vitamin D deficiency, elevated liver enzymes and hyperlipidemia. HPI  Since he started taking testosterone therapy twice a week, he feels much improved.  He has more energy. He has been taking vitamin D3 10,000 units daily for vitamin D deficiency. He tries to eat healthy on a daily basis but he does eat animal protein every day. History reviewed. No pertinent past medical history. History reviewed. No pertinent surgical history.   Family History  Problem Relation Age of Onset  . Hypertension Mother   . Arthritis Father     Social History   Social History Narrative   Married for 6 years.Lives with wife,step son,father-in-law.Works for [employer].   Social History   Tobacco Use  . Smoking status: Smoker, Current Status Unknown    Packs/day: 3.00    Types: Cigarettes  . Smokeless tobacco: Never Used  . Tobacco comment: a pack every 3 days.  Substance Use Topics  . Alcohol use: Not Currently    Current Meds  Medication Sig  . Cholecalciferol (VITAMIN D-3) 125 MCG (5000 UT) TABS Take 2 tablets by mouth daily.  Marland Kitchen testosterone cypionate (DEPOTESTOSTERONE CYPIONATE) 200 MG/ML injection Inject 0.6 mLs (120 mg total) into the muscle 2 (two) times a week.       Depression screen [facility] 2/9 01/30/2020  Decreased Interest 0  Down, Depressed, Hopeless 0  PHQ - 2 Score 0     Objective:   Today's Vitals: BP 122/70 (BP Location: Right Arm, Patient Position: Sitting, Cuff Size: Normal)    Pulse 82   Temp 97.6 F (36.4 C) (Temporal)   Resp 17   Ht 6\' 1"  (1.854 m)   Wt 206 lb (93.4 kg)   SpO2 98%   BMI 27.18 kg/m  Vitals with BMI 05/23/2020 01/30/2020 12/21/2019  Height 6\' 1"  6\' 1"  6\' 1"   Weight 206 lbs 204 lbs 13 oz 209 lbs  BMI 27.18 27.03 27.58  Systolic 122 120 12/23/2019  Diastolic 70 65 70  Pulse 82 91 89     Physical Exam   He looks systemically well.  Blood pressure excellent.  Weight is stable.    Assessment   1. Low testosterone in male   2. Vitamin D deficiency disease   3. Abnormal liver enzymes       Tests ordered Orders Placed This Encounter  Procedures  . COMPLETE METABOLIC PANEL WITH GFR  . Testosterone Total,Free,Bio, Males  . VITAMIN D 25 Hydroxy (Vit-D Deficiency, Fractures)     Plan: 1. He will continue with testosterone twice a week and we will check levels today.  His last dose was 4 days ago. 2. He will continue with vitamin D3 10,000 units daily and I will check levels today. 3. We will also check liver enzymes which were mildly abnormal previously. 4. Further recommendations will depend on blood results and I will see him in about 3 to 4 months time for follow-up.  Today, I also discussed COVID-19 vaccination again and encouraged him to get the  vaccination as soon as possible.   No orders of the defined types were placed in this encounter.   Wilson Singer, MD

## 2020-05-24 LAB — COMPLETE METABOLIC PANEL WITH GFR
AG Ratio: 2.2 (calc) (ref 1.0–2.5)
ALT: 41 U/L (ref 9–46)
AST: 76 U/L — ABNORMAL HIGH (ref 10–40)
Albumin: 5 g/dL (ref 3.6–5.1)
Alkaline phosphatase (APISO): 78 U/L (ref 36–130)
BUN: 23 mg/dL (ref 7–25)
CO2: 30 mmol/L (ref 20–32)
Calcium: 9.8 mg/dL (ref 8.6–10.3)
Chloride: 101 mmol/L (ref 98–110)
Creat: 1.13 mg/dL (ref 0.60–1.35)
GFR, Est African American: 95 mL/min/{1.73_m2} (ref >=60)
GFR, Est Non African American: 82 mL/min/{1.73_m2} (ref >=60)
Globulin: 2.3 g/dL (calc) (ref 1.9–3.7)
Glucose, Bld: 72 mg/dL (ref 65–99)
Potassium: 3.8 mmol/L (ref 3.5–5.3)
Sodium: 141 mmol/L (ref 135–146)
Total Bilirubin: 0.6 mg/dL (ref 0.2–1.2)
Total Protein: 7.3 g/dL (ref 6.1–8.1)

## 2020-05-24 LAB — TESTOSTERONE TOTAL,FREE,BIO, MALES
Albumin: 5 g/dL (ref 3.6–5.1)
Sex Hormone Binding: 50 nmol/L (ref 10–50)
Testosterone, Bioavailable: 485.8 ng/dL (ref >=110.0)
Testosterone, Free: 213.6 pg/mL (ref 46.0–224.0)
Testosterone: 1713 ng/dL — ABNORMAL HIGH (ref 250–827)

## 2020-05-24 LAB — VITAMIN D 25 HYDROXY (VIT D DEFICIENCY, FRACTURES): Vit D, 25-Hydroxy: 57 ng/mL (ref 30–100)

## 2020-05-28 ENCOUNTER — Other Ambulatory Visit (INDEPENDENT_AMBULATORY_CARE_PROVIDER_SITE_OTHER): Payer: Self-pay | Admitting: Internal Medicine

## 2020-05-28 MED ORDER — VALACYCLOVIR HCL 500 MG PO TABS: 500.0000 mg | ORAL_TABLET | Freq: Two times a day (BID) | ORAL | 3 refills | Status: AC

## 2020-05-28 NOTE — Progress Notes (Signed)
Patient called. Left voicemail of instructions  & lab results. Ask pt top call back to set appt for blood work.

## 2020-05-28 NOTE — Progress Notes (Signed)
Please call this patient back.  Tell him that one of his liver enzymes is actually worse than it was before.His testosterone levels are great, so continue with the same dose of testosterone.Please make him an appointment to see me back in about 1 month to check on his elevated liver enzyme and we will need to do further blood tests also.  Thanks.

## 2020-08-26 ENCOUNTER — Other Ambulatory Visit (INDEPENDENT_AMBULATORY_CARE_PROVIDER_SITE_OTHER): Payer: Self-pay | Admitting: Internal Medicine

## 2020-09-10 ENCOUNTER — Ambulatory Visit (INDEPENDENT_AMBULATORY_CARE_PROVIDER_SITE_OTHER): Payer: 59 | Admitting: Internal Medicine

## 2020-09-10 ENCOUNTER — Telehealth (INDEPENDENT_AMBULATORY_CARE_PROVIDER_SITE_OTHER): Payer: Self-pay | Admitting: Nurse Practitioner

## 2020-09-10 ENCOUNTER — Other Ambulatory Visit: Payer: Self-pay

## 2020-09-10 ENCOUNTER — Encounter (INDEPENDENT_AMBULATORY_CARE_PROVIDER_SITE_OTHER): Payer: Self-pay | Admitting: Nurse Practitioner

## 2020-09-10 ENCOUNTER — Ambulatory Visit (INDEPENDENT_AMBULATORY_CARE_PROVIDER_SITE_OTHER): Payer: 59 | Admitting: Nurse Practitioner

## 2020-09-10 VITALS — BP 118/70 | HR 86 | Temp 98.2°F | Ht 73.0 in | Wt 215.6 lb

## 2020-09-10 DIAGNOSIS — R748 Abnormal levels of other serum enzymes: Secondary | ICD-10-CM | POA: Diagnosis not present

## 2020-09-10 NOTE — Telephone Encounter (Signed)
I ordered abdominal ultrasound for this patient.  Please make sure this gets scheduled.  Thank you.

## 2020-09-10 NOTE — Progress Notes (Signed)
Subjective:  Patient ID: Marvin Mcmahon, male    DOB: 06-15-1981  Age: 39 y.o. MRN: [id number]  CC:  Chief Complaint  Patient presents with  . Follow-up    discuss labs      HPI  This patient arrives today for the above.  It was noted at his last office visit in August that his AST remained elevated at a level of 76.  Prior to that in March of this year AST was at 36.  He tells me he does have a history of substance abuse disorder, including IV drug use.  He tells me he has been checked for hepatitis in the past when he was incarcerated.  At that time he tells me he was negative.  He does drink alcohol and tells me he drinks about 3-4 drinks once a week.  He will usually drink a few beers and maybe 1 shot of hard liquor.  Overall he tells me he is feeling well.  Past Medical History:  Diagnosis Date  . Hypertension    Had elevated BP in his 20's      Family History  Problem Relation Age of Onset  . Hypertension Mother   . Arthritis Father     Social History   Social History Narrative   Married for 6 years.Lives with wife,step son,father-in-law.Works for [employer].   Social History   Tobacco Use  . Smoking status: Current Every Day Smoker    Packs/day: 3.00    Types: Cigarettes  . Smokeless tobacco: Never Used  . Tobacco comment: a pack every 3 days.  Substance Use Topics  . Alcohol use: Yes    Alcohol/week: 2.0 standard drinks    Types: 2 Cans of beer per week    Comment: Occasional during week     Current Meds  Medication Sig  . Cholecalciferol (VITAMIN D-3) 125 MCG (5000 UT) TABS Take 2 tablets by mouth daily.  Marland Kitchen testosterone cypionate (DEPOTESTOSTERONE CYPIONATE) 200 MG/ML injection INJECT 0.6ML (120MG) INTO THE MUSCLE 2 TIMES PER WEEK  . valACYclovir (VALTREX) 500 MG tablet Take 1 tablet (500 mg total) by mouth 2 (two) times daily.    ROS:  Review of Systems  Constitutional: Negative for malaise/fatigue.    Gastrointestinal: Negative for abdominal pain, blood in stool, constipation, diarrhea, nausea and vomiting.  Skin: Negative for itching and rash.     Objective:   Today's Vitals: BP 118/70   Pulse 86   Temp 98.2 F (36.8 C) (Temporal)   Ht '6\' 1"'  (1.854 m)   Wt 215 lb 9.6 oz (97.8 kg)   SpO2 99%   BMI 28.44 kg/m  Vitals with BMI 09/10/2020 05/23/2020 01/30/2020  Height '6\' 1"'  '6\' 1"'  '6\' 1"'   Weight 215 lbs 10 oz 206 lbs 204 lbs 13 oz  BMI 28.45 28.31 51.76  Systolic 160 737 106  Diastolic 70 70 65  Pulse 86 82 91     Physical Exam Vitals reviewed.  Constitutional:      Appearance: Normal appearance.  HENT:     Head: Normocephalic and atraumatic.  Cardiovascular:     Rate and Rhythm: Normal rate and regular rhythm.  Pulmonary:     Effort: Pulmonary effort is normal.     Breath sounds: Normal breath sounds.  Abdominal:     General: Abdomen is flat. Bowel sounds are normal. There is no distension.     Palpations: Abdomen is soft. There is no hepatomegaly, splenomegaly or mass.  Tenderness: There is no abdominal tenderness.  Musculoskeletal:     Cervical back: Neck supple.  Skin:    General: Skin is warm and dry.  Neurological:     Mental Status: He is alert and oriented to person, place, and time.  Psychiatric:        Mood and Affect: Mood normal.        Behavior: Behavior normal.        Thought Content: Thought content normal.        Judgment: Judgment normal.          Assessment and Plan   1. Elevated liver enzymes      Plan: 1.  We will collect further blood work for further evaluation as well as send him for ultrasound of abdomen.  Recommendations may be made based upon these results.  He will follow-up in 6 weeks for close monitoring.   Tests ordered Orders Placed This Encounter  Procedures  . US Abdomen Complete  . Hep C Antibody  . Hep B Core Ab W/Reflex  . Hep B Surface Antibody  . Hep B Surface Antigen  . HIV antibody (with reflex)  .  Gamma GT  . Hep A Antibody, IgM  . CMP with eGFR([facility])      No orders of the defined types were placed in this encounter.   Patient to follow-up in 6 weeks or sooner as needed.  Ailene Ards, NP

## 2020-09-10 NOTE — Patient Instructions (Signed)
To schedule an appointment with [facility] for your lab draw visit [email or web address] or Call: [phone]. Or you may go to [facility] as a walk-in. Their [place] location address is [address], [place], . Their hours are Monday-Friday from 7:00AM-12:00PM and 1:00PM-5:00PM.    

## 2020-09-12 NOTE — Telephone Encounter (Signed)
Prior Auth faxed to [facility] health;pending approval.

## 2020-09-17 ENCOUNTER — Other Ambulatory Visit (INDEPENDENT_AMBULATORY_CARE_PROVIDER_SITE_OTHER): Payer: 59

## 2020-09-19 ENCOUNTER — Ambulatory Visit (HOSPITAL_COMMUNITY)
Admission: RE | Admit: 2020-09-19 | Discharge: 2020-09-19 | Disposition: A | Discharge status: Home / Self Care | Payer: 59 | Source: Ambulatory Visit | Attending: Nurse Practitioner | Admitting: Nurse Practitioner

## 2020-09-19 ENCOUNTER — Other Ambulatory Visit: Payer: Self-pay

## 2020-09-19 ENCOUNTER — Other Ambulatory Visit (INDEPENDENT_AMBULATORY_CARE_PROVIDER_SITE_OTHER): Payer: 59

## 2020-09-19 DIAGNOSIS — R748 Abnormal levels of other serum enzymes: Secondary | ICD-10-CM

## 2020-09-20 LAB — HEPATITIS B SURFACE ANTIGEN: Hepatitis B Surface Ag: NONREACTIVE

## 2020-09-20 LAB — HEPATITIS C ANTIBODY
Hepatitis C Ab: NONREACTIVE
SIGNAL TO CUT-OFF: 0.01 (ref <1.00)

## 2020-09-20 LAB — COMPLETE METABOLIC PANEL WITH GFR
AG Ratio: 2 (calc) (ref 1.0–2.5)
ALT: 41 U/L (ref 9–46)
AST: 49 U/L — ABNORMAL HIGH (ref 10–40)
Albumin: 4.5 g/dL (ref 3.6–5.1)
Alkaline phosphatase (APISO): 83 U/L (ref 36–130)
BUN: 23 mg/dL (ref 7–25)
CO2: 27 mmol/L (ref 20–32)
Calcium: 9.6 mg/dL (ref 8.6–10.3)
Chloride: 100 mmol/L (ref 98–110)
Creat: 1.1 mg/dL (ref 0.60–1.35)
GFR, Est African American: 98 mL/min/{1.73_m2} (ref >=60)
GFR, Est Non African American: 85 mL/min/{1.73_m2} (ref >=60)
Globulin: 2.2 g/dL (calc) (ref 1.9–3.7)
Glucose, Bld: 77 mg/dL (ref 65–139)
Potassium: 4.5 mmol/L (ref 3.5–5.3)
Sodium: 138 mmol/L (ref 135–146)
Total Bilirubin: 0.4 mg/dL (ref 0.2–1.2)
Total Protein: 6.7 g/dL (ref 6.1–8.1)

## 2020-09-20 LAB — HEPATITIS B SURFACE ANTIBODY,QUALITATIVE: Hep B S Ab: NONREACTIVE

## 2020-09-20 LAB — HEPATITIS A ANTIBODY, IGM: Hep A IgM: NONREACTIVE

## 2020-09-20 LAB — HIV ANTIBODY (ROUTINE TESTING W REFLEX): HIV 1&2 Ab, 4th Generation: NONREACTIVE

## 2020-09-20 LAB — GAMMA GT: GGT: 17 U/L (ref 3–90)

## 2020-10-22 ENCOUNTER — Ambulatory Visit (INDEPENDENT_AMBULATORY_CARE_PROVIDER_SITE_OTHER): Payer: 59 | Admitting: Nurse Practitioner

## 2020-10-30 ENCOUNTER — Other Ambulatory Visit (INDEPENDENT_AMBULATORY_CARE_PROVIDER_SITE_OTHER): Payer: Self-pay | Admitting: Internal Medicine

## 2020-11-05 ENCOUNTER — Encounter (INDEPENDENT_AMBULATORY_CARE_PROVIDER_SITE_OTHER): Payer: Self-pay | Admitting: Internal Medicine

## 2020-11-05 ENCOUNTER — Ambulatory Visit (INDEPENDENT_AMBULATORY_CARE_PROVIDER_SITE_OTHER): Payer: 59 | Admitting: Internal Medicine

## 2020-11-05 ENCOUNTER — Other Ambulatory Visit: Payer: Self-pay

## 2020-11-05 VITALS — BP 110/72 | HR 78 | Temp 98.4°F | Ht 73.0 in | Wt 213.2 lb

## 2020-11-05 DIAGNOSIS — E559 Vitamin D deficiency, unspecified: Secondary | ICD-10-CM

## 2020-11-05 DIAGNOSIS — R748 Abnormal levels of other serum enzymes: Secondary | ICD-10-CM

## 2020-11-05 DIAGNOSIS — E785 Hyperlipidemia, unspecified: Secondary | ICD-10-CM

## 2020-11-05 DIAGNOSIS — R7989 Other specified abnormal findings of blood chemistry: Secondary | ICD-10-CM | POA: Diagnosis not present

## 2020-11-05 NOTE — Progress Notes (Signed)
Metrics: Intervention Frequency ACO  Documented Smoking Status Yearly  Screened one or more times in 24 months  Cessation Counseling or  Active cessation medication Past 24 months  Past 24 months   Guideline developer: UpToDate (See UpToDate for funding source) Date Released: 2014       Wellness Office Visit  Subjective:  Patient ID: Marvin Mcmahon, male    DOB: 09-Dec-1980  Age: 40 y.o. MRN: [id number]  CC: This man comes in for follow-up of testosterone therapy, elevated liver enzymes and vitamin D deficiency.  He also has dyslipidemia which is mild. HPI  Overall, his diet has been not optimal in the winter months.  He continues to take testosterone twice a week without any problems and is tolerating this well. He and his wife are going to be doing a [place] race which involves 5K with obstacles. His liver enzymes were checked again and hepatitis panel was negative.  Also an ultrasound was done of the liver which was also negative. Past Medical History:  Diagnosis Date  . Hypertension    Had elevated BP in his 20's   Past Surgical History:  Procedure Laterality Date  . CYST EXCISION     On face     Family History  Problem Relation Age of Onset  . Hypertension Mother   . Arthritis Father     Social History   Social History Narrative   Married for 6 years.Lives with wife,step son,father-in-law.Works for [employer].   Social History   Tobacco Use  . Smoking status: Current Every Day Smoker    Packs/day: 3.00    Types: Cigarettes  . Smokeless tobacco: Never Used  . Tobacco comment: a pack every 3 days.  Substance Use Topics  . Alcohol use: Yes    Alcohol/week: 2.0 standard drinks    Types: 2 Cans of beer per week    Comment: Occasional during week    Current Meds  Medication Sig  . Cholecalciferol (VITAMIN D-3) 125 MCG (5000 UT) TABS Take 2 tablets by mouth daily.  Marland Kitchen testosterone cypionate (DEPOTESTOSTERONE CYPIONATE) 200 MG/ML  injection INJECT 0.6ML (120MG ) INTO THE MUSCLE 2 TIMES PER WEEK  . valACYclovir (VALTREX) 500 MG tablet Take 1 tablet (500 mg total) by mouth 2 (two) times daily.      Depression screen [facility] 2/9 09/10/2020 01/30/2020  Decreased Interest 0 0  Down, Depressed, Hopeless 0 0  PHQ - 2 Score 0 0  Altered sleeping 0 -  Tired, decreased energy 0 -  Change in appetite 0 -  Feeling bad or failure about yourself  0 -  Trouble concentrating 0 -  Moving slowly or fidgety/restless 0 -  Suicidal thoughts 0 -  PHQ-9 Score 0 -  Difficult doing work/chores Not difficult at all -     Objective:   Today's Vitals: BP 110/72   Pulse 78   Temp 98.4 F (36.9 C) (Temporal)   Ht 6\' 1"  (1.854 m)   Wt 213 lb 3.2 oz (96.7 kg)   SpO2 99%   BMI 28.13 kg/m  Vitals with BMI 11/05/2020 09/10/2020 05/23/2020  Height 6\' 1"  6\' 1"  6\' 1"   Weight 213 lbs 3 oz 215 lbs 10 oz 206 lbs  BMI 28.13 28.45 27.18  Systolic 110 118 14/03/2020  Diastolic 72 70 70  Pulse 78 86 82     Physical Exam    He looks systemically well.  Weight is very stable.  Blood pressure is excellent.   Assessment  1. Low testosterone in male   2. Elevated liver enzymes   3. Dyslipidemia   4. Vitamin D deficiency disease       Tests ordered Orders Placed This Encounter  Procedures  . COMPLETE METABOLIC PANEL WITH GFR     Plan: 1. He will continue with testosterone therapy as before and he does not require refills today. 2. He will continue with vitamin D3 for vitamin D deficiency and his levels on the last visit were reasonable. 3. I will repeat his liver enzymes today. 4. Follow-up with me in about 3 months for an annual physical exam.   No orders of the defined types were placed in this encounter.   Wilson Singer, MD

## 2020-11-06 LAB — COMPLETE METABOLIC PANEL WITH GFR
AG Ratio: 2 (calc) (ref 1.0–2.5)
ALT: 34 U/L (ref 9–46)
AST: 33 U/L (ref 10–40)
Albumin: 4.9 g/dL (ref 3.6–5.1)
Alkaline phosphatase (APISO): 77 U/L (ref 36–130)
BUN: 14 mg/dL (ref 7–25)
CO2: 27 mmol/L (ref 20–32)
Calcium: 9.8 mg/dL (ref 8.6–10.3)
Chloride: 101 mmol/L (ref 98–110)
Creat: 1.08 mg/dL (ref 0.60–1.35)
GFR, Est African American: 100 mL/min/{1.73_m2} (ref >=60)
GFR, Est Non African American: 87 mL/min/{1.73_m2} (ref >=60)
Globulin: 2.4 g/dL (calc) (ref 1.9–3.7)
Glucose, Bld: 94 mg/dL (ref 65–139)
Potassium: 4.4 mmol/L (ref 3.5–5.3)
Sodium: 138 mmol/L (ref 135–146)
Total Bilirubin: 0.5 mg/dL (ref 0.2–1.2)
Total Protein: 7.3 g/dL (ref 6.1–8.1)

## 2020-11-06 NOTE — Progress Notes (Signed)
Please call the patient and let him know that his liver enzymes are all normal now.  Good news.  Follow-up is scheduled.

## 2020-11-12 ENCOUNTER — Ambulatory Visit (INDEPENDENT_AMBULATORY_CARE_PROVIDER_SITE_OTHER): Payer: 59 | Admitting: Internal Medicine

## 2020-12-10 ENCOUNTER — Encounter: Payer: Self-pay | Admitting: Emergency Medicine

## 2020-12-10 ENCOUNTER — Ambulatory Visit
Admission: EM | Admit: 2020-12-10 | Discharge: 2020-12-10 | Disposition: A | Discharge status: Home / Self Care | Payer: 59 | Attending: Emergency Medicine | Admitting: Emergency Medicine

## 2020-12-10 ENCOUNTER — Other Ambulatory Visit: Payer: Self-pay

## 2020-12-10 DIAGNOSIS — J0140 Acute pansinusitis, unspecified: Secondary | ICD-10-CM

## 2020-12-10 MED ORDER — DOXYCYCLINE HYCLATE 100 MG PO CAPS: 100.0000 mg | ORAL_CAPSULE | Freq: Two times a day (BID) | ORAL | 0 refills | Status: DC

## 2020-12-10 MED ORDER — PREDNISONE 10 MG PO TABS: 20.0000 mg | ORAL_TABLET | Freq: Every day | ORAL | 0 refills | Status: DC

## 2020-12-10 MED ORDER — CETIRIZINE HCL 10 MG PO TABS: 10.0000 mg | ORAL_TABLET | Freq: Every day | ORAL | 0 refills | Status: DC

## 2020-12-10 MED ORDER — BENZONATATE 100 MG PO CAPS: 100.0000 mg | ORAL_CAPSULE | Freq: Three times a day (TID) | ORAL | 0 refills | Status: DC | PRN

## 2020-12-10 NOTE — Discharge Instructions (Signed)
Get plenty of rest and push fluids Tessalon Perles prescribed for cough Prednisone as prescribed Doxycycline was prescribed Zyrtec for nasal congestion, runny nose, and/or sore throat Use OTC Flonase for nasal congestion Use medications daily for symptom relief Use OTC medications like ibuprofen or tylenol as needed fever or pain Call or go to the ED if you have any new or worsening symptoms such as fever, worsening cough, shortness of breath, chest tightness, chest pain, turning blue, changes in mental status, etc..Marland Kitchen

## 2020-12-10 NOTE — ED Provider Notes (Signed)
[facility]   [id number] 12/10/20 Arrival Time: [phone]   Chief Complaint  Patient presents with  . URI      SUBJECTIVE: History from: patient.  Marvin Mcmahon is a 40 y.o. male who presented to the urgent care with a complaint of cough with brownish-green sputum, nasal congestion and sinus pressure and paijn for the past 2 to 3 days.  Denies sick exposure to COVID, flu or strep.  Denies recent travel.  Has tried OTC medication without relief.  Denies alleviating or aggravating factors.  Denies previous symptoms in the past.   Denies fever, chills, fatigue, sinus pain, rhinorrhea, sore throat, SOB, wheezing, chest pain, nausea, changes in bowel or bladder habits.     ROS: As per HPI.  All other pertinent ROS negative.     Past Medical History:  Diagnosis Date  . Hypertension    Had elevated BP in his 20's   Past Surgical History:  Procedure Laterality Date  . CYST EXCISION     On face   No Known Allergies No current facility-administered medications on file prior to encounter.   Current Outpatient Medications on File Prior to Encounter  Medication Sig Dispense Refill  . Cholecalciferol (VITAMIN D-3) 125 MCG (5000 UT) TABS Take 2 tablets by mouth daily.    Marland Kitchen testosterone cypionate (DEPOTESTOSTERONE CYPIONATE) 200 MG/ML injection INJECT 0.6ML (120MG ) INTO THE MUSCLE 2 TIMES PER WEEK 10 mL 2  . valACYclovir (VALTREX) 500 MG tablet Take 1 tablet (500 mg total) by mouth 2 (two) times daily. 60 tablet 3   Social History   Socioeconomic History  . Marital status: Married    Spouse name: Not on file  . Number of children: Not on file  . Years of education: Not on file  . Highest education level: Not on file  Occupational History  . Not on file  Tobacco Use  . Smoking status: Current Every Day Smoker    Packs/day: 3.00    Types: Cigarettes  . Smokeless tobacco: Never Used  . Tobacco comment: a pack every 3 days.  Vaping Use  . Vaping Use: Former  Substance  and Sexual Activity  . Alcohol use: Yes    Alcohol/week: 2.0 standard drinks    Types: 2 Cans of beer per week    Comment: Occasional during week  . Drug use: Not Currently    Types: Marijuana, Hydrocodone  . Sexual activity: Yes    Partners: Female  Other Topics Concern  . Not on file  Social History Narrative   Married for 6 years.Lives with wife,step son,father-in-law.Works for .   Social Determinants of Health   Financial Resource Strain: Not on file  Food Insecurity: Not on file  Transportation Needs: Not on file  Physical Activity: Not on file  Stress: Not on file  Social Connections: Not on file  Intimate Partner Violence: Not on file   Family History  Problem Relation Age of Onset  . Hypertension Mother   . Arthritis Father     OBJECTIVE:  Vitals:   12/10/20 0902  BP: (!) 147/91  Pulse: 86  Resp: 18  Temp: 98 F (36.7 C)  TempSrc: Oral  SpO2: 97%     General appearance: alert; appears fatigued, but nontoxic; speaking in full sentences and tolerating own secretions HEENT: NCAT; Ears: EACs clear, TMs pearly gray; Eyes: PERRL.  EOM grossly intact. Sinuses: maxillary sinus tender; Nose: nares patent without rhinorrhea, Throat: oropharynx clear, tonsils non erythematous or enlarged,  uvula midline  Neck: supple without LAD Lungs: unlabored respirations, symmetrical air entry; cough: moderate; no respiratory distress; CTAB Heart: regular rate and rhythm.  Radial pulses 2+ symmetrical bilaterally Skin: warm and dry Psychological: alert and cooperative; normal mood and affect  LABS:  No results found for this or any previous visit (from the past 24 hour(s)).   ASSESSMENT & PLAN:  1. Acute non-recurrent pansinusitis     Meds ordered this encounter  Medications  . benzonatate (TESSALON) 100 MG capsule    Sig: Take 1 capsule (100 mg total) by mouth 3 (three) times daily as needed for cough.    Dispense:  30 capsule     Refill:  0  . predniSONE (DELTASONE) 10 MG tablet    Sig: Take 2 tablets (20 mg total) by mouth daily.    Dispense:  15 tablet    Refill:  0  . doxycycline (VIBRAMYCIN) 100 MG capsule    Sig: Take 1 capsule (100 mg total) by mouth 2 (two) times daily.    Dispense:  20 capsule    Refill:  0  . cetirizine (ZYRTEC ALLERGY) 10 MG tablet    Sig: Take 1 tablet (10 mg total) by mouth daily.    Dispense:  30 tablet    Refill:  0    Discharge instructions  Get plenty of rest and push fluids Tessalon Perles prescribed for cough Prednisone as prescribed Doxycycline was prescribed Zyrtec for nasal congestion, runny nose, and/or sore throat Use OTC Flonase for nasal congestion Use medications daily for symptom relief Use OTC medications like ibuprofen or tylenol as needed fever or pain Call or go to the ED if you have any new or worsening symptoms such as fever, worsening cough, shortness of breath, chest tightness, chest pain, turning blue, changes in mental status, etc...   Reviewed expectations re: course of current medical issues. Questions answered. Outlined signs and symptoms indicating need for more acute intervention. Patient verbalized understanding. After Visit Summary given.         Durward Parcel, FNP 12/10/20 [phone]

## 2020-12-10 NOTE — ED Triage Notes (Signed)
Took at home covid test that was negative.  productive Cough with brownish green sputum, sinus pressure, nasal congestion greenish in color.  Symptoms started on Friday.

## 2021-02-04 ENCOUNTER — Other Ambulatory Visit: Payer: Self-pay

## 2021-02-04 ENCOUNTER — Ambulatory Visit (INDEPENDENT_AMBULATORY_CARE_PROVIDER_SITE_OTHER): Payer: 59 | Admitting: Internal Medicine

## 2021-02-04 ENCOUNTER — Encounter (INDEPENDENT_AMBULATORY_CARE_PROVIDER_SITE_OTHER): Payer: Self-pay | Admitting: Internal Medicine

## 2021-02-04 VITALS — BP 100/66 | HR 80 | Temp 97.5°F | Ht 72.5 in | Wt 201.4 lb

## 2021-02-04 DIAGNOSIS — Z0000 Encounter for general adult medical examination without abnormal findings: Secondary | ICD-10-CM | POA: Diagnosis not present

## 2021-02-04 DIAGNOSIS — E559 Vitamin D deficiency, unspecified: Secondary | ICD-10-CM | POA: Diagnosis not present

## 2021-02-04 DIAGNOSIS — E785 Hyperlipidemia, unspecified: Secondary | ICD-10-CM | POA: Diagnosis not present

## 2021-02-04 DIAGNOSIS — R7989 Other specified abnormal findings of blood chemistry: Secondary | ICD-10-CM

## 2021-02-04 DIAGNOSIS — Z125 Encounter for screening for malignant neoplasm of prostate: Secondary | ICD-10-CM

## 2021-02-04 MED ORDER — TESTOSTERONE CYPIONATE 200 MG/ML IM SOLN: 120.0000 mg | INTRAMUSCULAR | 2 refills | Status: DC

## 2021-02-04 NOTE — Progress Notes (Signed)
Chief Complaint: This pleasant 40 year old man comes in for an annual physical exam. HPI: He is doing well.  He continues with testosterone therapy twice a week. He did have elevation in liver enzymes which have since normalized and causes of abnormal elevation liver enzymes were all negative so far. He does smoke half a pack of cigarettes a day and is trying to quit.  He says he wants to have been quit when he reaches age of 59. He continues to exercise on a regular basis.  He tries to eat healthy. Takes vitamin D3 supplementation for vitamin D deficiency.  History reviewed. No pertinent past medical history. Past Surgical History:  Procedure Laterality Date  . CYST EXCISION     On face     Social History   Social History Narrative   Married for 7 years.Lives with wife,step son,father-in-law.Works for [employer].    Social History   Tobacco Use  . Smoking status: Current Every Day Smoker    Packs/day: 0.50    Types: Cigarettes  . Smokeless tobacco: Never Used  Substance Use Topics  . Alcohol use: Yes    Alcohol/week: 2.0 standard drinks    Types: 2 Cans of beer per week    Comment: Occasional during week      Allergies: No Known Allergies   Current Meds  Medication Sig  . cetirizine (ZYRTEC ALLERGY) 10 MG tablet Take 1 tablet (10 mg total) by mouth daily.  . Cholecalciferol (VITAMIN D-3) 125 MCG (5000 UT) TABS Take 2 tablets by mouth daily.  . valACYclovir (VALTREX) 500 MG tablet Take 1 tablet (500 mg total) by mouth 2 (two) times daily.  . [DISCONTINUED] testosterone cypionate (DEPOTESTOSTERONE CYPIONATE) 200 MG/ML injection INJECT 0.6ML (120MG ) INTO THE MUSCLE 2 TIMES PER WEEK     Flowsheet Row Office Visit from 02/04/2021 in [facility]  PHQ-9 Total Score 0      Pyrgos from the symptoms mentioned above,there are no other symptoms referable to all systems reviewed.       Physical Exam: Blood pressure 100/66,  pulse 80, temperature (!) 97.5 F (36.4 C), temperature source Temporal, height 6' 0.5" (1.842 m), weight 201 lb 6.4 oz (91.4 kg), SpO2 99 %. Vitals with BMI 02/04/2021 12/10/2020 11/05/2020  Height 6' 0.5" - 6\' 1"   Weight 201 lbs 6 oz - 213 lbs 3 oz  BMI 26.92 - 28.13  Systolic 100 147 11/07/2020  Diastolic 66 91 72  Pulse 80 86 78      Looks systemically well.  Muscular.  Blood pressure excellent. General: Alert, cooperative, and appears to be the stated age.No pallor.  No jaundice.  No clubbing. Head: Normocephalic Eyes: Sclera white, pupils equal and reactive to light, red reflex x 2,  Ears: Normal bilaterally Oral cavity: Lips, mucosa, and tongue normal: Teeth and gums normal Neck: No adenopathy, supple, symmetrical, trachea midline, and thyroid does not appear enlarged Respiratory: Clear to auscultation bilaterally.No wheezing, crackles or bronchial breathing. Cardiovascular: Heart sounds are present and appear to be normal without murmurs or added sounds.  No carotid bruits.  Peripheral pulses are present and equal bilaterally.: Gastrointestinal:positive bowel sounds, no hepatosplenomegaly.  No masses felt.No tenderness. Skin: Clear, No rashes noted.No worrisome skin lesions seen. Neurological: Grossly intact without focal findings, cranial nerves II through XII intact, muscle strength equal bilaterally Musculoskeletal: No acute joint abnormalities noted.Full range of movement noted with joints. Psychiatric: Affect appropriate, non-anxious.    Assessment  1. Routine general medical examination at  a health care facility   2. Low testosterone in male   3. Dyslipidemia   4. Vitamin D deficiency disease   5. Special screening for malignant neoplasm of prostate     Tests Ordered:   Orders Placed This Encounter  Procedures  . PSA, Total with Reflex to PSA, Free  . Lipid panel     Plan  1. Healthy 40 year old man who is trying to quit smoking cigarettes. 2. Blood work is  ordered. 3. I will see him in about 6 months time for follow-up.     Meds ordered this encounter  Medications  . testosterone cypionate (DEPOTESTOSTERONE CYPIONATE) 200 MG/ML injection    Sig: Inject 0.6 mLs (120 mg total) into the muscle 2 (two) times a week.    Dispense:  10 mL    Refill:  2     Jayan Raymundo C Alydia Gosser   02/04/2021, 9:16 AM

## 2021-02-05 LAB — LIPID PANEL
Cholesterol: 208 mg/dL — ABNORMAL HIGH (ref <200)
HDL: 59 mg/dL (ref >=40)
LDL Cholesterol (Calc): 129 mg/dL (calc) — ABNORMAL HIGH
Non-HDL Cholesterol (Calc): 149 mg/dL (calc) — ABNORMAL HIGH (ref <130)
Total CHOL/HDL Ratio: 3.5 (calc) (ref <5.0)
Triglycerides: 95 mg/dL (ref <150)

## 2021-02-05 LAB — PSA, TOTAL WITH REFLEX TO PSA, FREE: PSA, Total: 0.5 ng/mL (ref <=4.0)

## 2021-02-06 ENCOUNTER — Encounter (INDEPENDENT_AMBULATORY_CARE_PROVIDER_SITE_OTHER): Payer: Self-pay | Admitting: Internal Medicine

## 2021-03-18 ENCOUNTER — Emergency Department (HOSPITAL_COMMUNITY)
Admission: EM | Admit: 2021-03-18 | Discharge: 2021-03-18 | Disposition: A | Discharge status: Home / Self Care | Payer: 59 | Attending: Emergency Medicine | Admitting: Emergency Medicine

## 2021-03-18 ENCOUNTER — Encounter (HOSPITAL_COMMUNITY): Payer: Self-pay | Admitting: Emergency Medicine

## 2021-03-18 ENCOUNTER — Emergency Department (HOSPITAL_COMMUNITY): Payer: 59

## 2021-03-18 ENCOUNTER — Other Ambulatory Visit: Payer: Self-pay

## 2021-03-18 DIAGNOSIS — X58XXXA Exposure to other specified factors, initial encounter: Secondary | ICD-10-CM | POA: Diagnosis not present

## 2021-03-18 DIAGNOSIS — S3994XA Unspecified injury of external genitals, initial encounter: Secondary | ICD-10-CM | POA: Diagnosis present

## 2021-03-18 DIAGNOSIS — F17210 Nicotine dependence, cigarettes, uncomplicated: Secondary | ICD-10-CM | POA: Diagnosis not present

## 2021-03-18 DIAGNOSIS — S3022XA Contusion of scrotum and testes, initial encounter: Secondary | ICD-10-CM | POA: Diagnosis not present

## 2021-03-18 DIAGNOSIS — R52 Pain, unspecified: Secondary | ICD-10-CM

## 2021-03-18 MED ORDER — NAPROXEN 375 MG PO TABS: 375.0000 mg | ORAL_TABLET | Freq: Two times a day (BID) | ORAL | 0 refills | Status: DC

## 2021-03-18 NOTE — ED Triage Notes (Signed)
Pt c/o groin pain that's started Monday after having intercourse. Pt concerned now for a knot that has developed and swelling.

## 2021-03-18 NOTE — ED Provider Notes (Signed)
[facility] EMERGENCY DEPARTMENT Provider Note   CSN: [id number] Arrival date & time: 03/18/21  1535     History Chief Complaint  Patient presents with   Groin Pain    Marvin Mcmahon is a 40 y.o. male.   Groin Pain   Patient presents to the ED with complaints of penile swelling.  Patient states he was having intercourse last Monday when he felt an acute sharp pain.  Patient subsequently developed a large amount of bruising and swelling around the penis.  It has slowly decreased but he continues to feel soreness and an area of swelling underneath his penis at the base of the shaft.  Patient states that area is tender to touch.  He denies any fevers.  He feels like the swelling overall is going down.  He states he is still able to get an erection in the morning.  He was concerned he may have fractured his penis so he came to the ED  History reviewed. No pertinent past medical history.  There are no problems to display for this patient.   Past Surgical History:  Procedure Laterality Date   CYST EXCISION     On face       Family History  Problem Relation Age of Onset   Hypertension Mother    Arthritis Father     Social History   Tobacco Use   Smoking status: Every Day    Packs/day: 0.50    Pack years: 0.00    Types: Cigarettes   Smokeless tobacco: Never  Vaping Use   Vaping Use: Former  Substance Use Topics   Alcohol use: Yes    Alcohol/week: 2.0 standard drinks    Types: 2 Cans of beer per week    Comment: Occasional during week   Drug use: Not Currently    Types: Marijuana, Hydrocodone    Home Medications Prior to Admission medications   Medication Sig Start Date End Date Taking? Authorizing Provider  naproxen (NAPROSYN) 375 MG tablet Take 1 tablet (375 mg total) by mouth 2 (two) times daily. 03/18/21  Yes Linwood Dibbles, MD  benzonatate (TESSALON) 100 MG capsule Take 1 capsule (100 mg total) by mouth 3 (three) times daily as needed for cough. Patient not  taking: No sig reported 12/10/20   Durward Parcel, FNP  cetirizine (ZYRTEC ALLERGY) 10 MG tablet Take 1 tablet (10 mg total) by mouth daily. 12/10/20   Avegno, Zachery Dakins, FNP  Cholecalciferol (VITAMIN D-3) 125 MCG (5000 UT) TABS Take 2 tablets by mouth daily.    [provider]  testosterone cypionate (DEPOTESTOSTERONE CYPIONATE) 200 MG/ML injection Inject 0.6 mLs (120 mg total) into the muscle 2 (two) times a week. 02/04/21   Wilson Singer, MD  valACYclovir (VALTREX) 500 MG tablet Take 1 tablet (500 mg total) by mouth 2 (two) times daily. 05/28/20   Wilson Singer, MD    Allergies    Patient has no known allergies.  Review of Systems   Review of Systems  All other systems reviewed and are negative.  Physical Exam Updated Vital Signs BP (!) 166/100 (BP Location: Right Arm)   Pulse 88   Temp 98.6 F (37 C) (Oral)   Resp 16   Ht 1.854 m (6\' 1" )   Wt 87.5 kg   SpO2 97%   BMI 25.46 kg/m   Physical Exam Vitals and nursing note reviewed.  Constitutional:      General: He is not in acute distress.  Appearance: He is well-developed.  HENT:     Head: Normocephalic and atraumatic.     Right Ear: External ear normal.     Left Ear: External ear normal.  Eyes:     General: No scleral icterus.       Right eye: No discharge.        Left eye: No discharge.     Conjunctiva/sclera: Conjunctivae normal.  Neck:     Trachea: No tracheal deviation.  Cardiovascular:     Rate and Rhythm: Normal rate.  Pulmonary:     Effort: Pulmonary effort is normal. No respiratory distress.     Breath sounds: No stridor.  Abdominal:     General: There is no distension.  Genitourinary:    Comments: Ecchymoses along the penile shaft in the suprapubic region, area of tenderness and swelling at the base of the penis adjacent to the perineal region, no testicular tenderness or mass Musculoskeletal:        General: No swelling or deformity.     Cervical back: Neck supple.  Skin:     General: Skin is warm and dry.     Findings: No rash.  Neurological:     Mental Status: He is alert.     Cranial Nerves: Cranial nerve deficit: no gross deficits.    ED Results / Procedures / Treatments     Procedures Procedures   Medications Ordered in ED Medications - No data to display  ED Course  I have reviewed the triage vital signs and the nursing notes.  Pertinent labs & imaging results that were available during my care of the patient were reviewed by me and considered in my medical decision making (see chart for details).  Clinical Course as of 03/18/21 1702  Mon Mar 18, 2021  1647 Dw Dr Ronne Binning.  Most likely tore emmisary veins to the corpora [JK]  1701 Ordered scrotal US initially but after discussion with Dr Ronne Binning will cancel [place] have pt follow up in the office. [JK]    Clinical Course User Index [JK] Linwood Dibbles, MD   MDM Rules/Calculators/A&P                          Patient presented with concerns of possible penile fracture with an injury that occurred about a week ago.  Patient does have evidence of ecchymoses and swelling.  Discussed the case with Dr. Ronne Binning.  Patient most likely torn emissary vein to the corpora.  These are often at the base of the penis.  The fact that the patient is able to have an erection argues against any penile fracture.  He will see the patient in the office for further evaluation. Final Clinical Impression(s) / ED Diagnoses Final diagnoses:  Hematoma of scrotum    Rx / DC Orders ED Discharge Orders          Ordered    naproxen (NAPROSYN) 375 MG tablet  2 times daily        03/18/21 1701             Linwood Dibbles, MD 03/18/21 1702

## 2021-03-18 NOTE — Discharge Instructions (Addendum)
Apply ice to help with any swelling, follow-up with Dr. Ronne Binning for further evaluation.  Take Naprosyn as needed.

## 2021-03-20 ENCOUNTER — Ambulatory Visit (INDEPENDENT_AMBULATORY_CARE_PROVIDER_SITE_OTHER): Payer: 59 | Admitting: Urology

## 2021-03-20 ENCOUNTER — Encounter: Payer: Self-pay | Admitting: Urology

## 2021-03-20 ENCOUNTER — Other Ambulatory Visit: Payer: Self-pay

## 2021-03-20 VITALS — BP 136/72 | HR 99 | Ht 73.0 in | Wt 200.0 lb

## 2021-03-20 DIAGNOSIS — S39840S Fracture of corpus cavernosum penis, sequela: Secondary | ICD-10-CM

## 2021-03-20 DIAGNOSIS — F17210 Nicotine dependence, cigarettes, uncomplicated: Secondary | ICD-10-CM

## 2021-03-20 DIAGNOSIS — N50819 Testicular pain, unspecified: Secondary | ICD-10-CM

## 2021-03-20 DIAGNOSIS — N509 Disorder of male genital organs, unspecified: Secondary | ICD-10-CM

## 2021-03-20 MED ORDER — HYDROCODONE-ACETAMINOPHEN 5-325 MG PO TABS: 1.0000 | ORAL_TABLET | Freq: Four times a day (QID) | ORAL | 0 refills | Status: AC | PRN

## 2021-03-20 NOTE — Progress Notes (Signed)
03/20/21 2:08 PM   Gus Height 1980/12/18 [id number]  CC: Possible penile fracture  HPI: I saw Mr. Marvin Mcmahon in urology clinic for possible penile fracture.  He is a 40 year old male [place] and having sexual intercourse with his wife and hit the her pubic bone and heard a loud pop with immediate detumescence and bruising.  He did not present to the ED till a few days later when he got back home to [place] and went to [facility] on 03/18/2021.  The urologist on-call there Dr. Ronne Binning was consulted and recommended follow-up the next day with him in clinic, but somehow he was added to my schedule here in [place] today.  No imaging was performed at that ED visit.  He reports he had a similar episode about 8 years ago and has had persistent 20 degree leftward curvature since that time that is not bothersome, and does not prevent intercourse.  He is having some ongoing pain behind the testicles in the perineum.  He denies hematuria at any time.  He is still getting morning erections that are his normal firmness and duration.  The hematoma has been decreasing in size over the last few days.  He denies any fevers or chills, or trouble with urination.  He denies any change in his baseline 20 degree leftward curvature.  He is on testosterone from his PCP, and testicles are atrophic.   PMH: History reviewed. No pertinent past medical history.  Surgical History: Past Surgical History:  Procedure Laterality Date   CYST EXCISION     On face      Family History: Family History  Problem Relation Age of Onset   Hypertension Mother    Arthritis Father    Bladder Cancer Neg Hx    Kidney cancer Neg Hx    Prostate cancer Neg Hx     Social History:  reports that he has been smoking cigarettes. He has been smoking an average of 0.50 packs per day. He has never used smokeless tobacco. He reports current alcohol use of about 2.0 standard drinks of alcohol per  week. He reports previous drug use. Drugs: Marijuana and Hydrocodone.  Physical Exam: BP 136/72   Pulse 99   Ht 6\' 1"  (1.854 m)   Wt 200 lb (90.7 kg)   BMI 26.39 kg/m    Constitutional:  Alert and oriented, No acute distress. Cardiovascular: No clubbing, cyanosis, or edema. Respiratory: Normal respiratory effort, no increased work of breathing. GI: Abdomen is soft, nontender, nondistended, no abdominal masses GU: Circumcised phallus with patent meatus, bruising at the left distal shaft, as well as at the base of the penis and mildly at the perineum.  Testicles are atrophic and 10 cc without masses.  There is a palpable mass behind the testicles in the perineum that is tender and likely represents hematoma   Assessment & Plan:   40 year old male with possible penile fracture presenting in delayed fashion.  He is getting normal morning erections, is not having any hematuria, and denies any increase in his baseline 20 degree curvature.  We discussed options at length including penile MRI, scrotal ultrasound, penile exploration, or observation.  We discussed risks and benefits extensively.  He has hesitant to undergo any surgery or exploration since he is having normal erections at this time which is not unreasonable.  We discussed the risk of ED in the future, or worsening curvature/peyronies disease by deferring any further imaging or intervention at this  time.  I think he has a good understanding of these risk.  I recommended icing, NSAIDs, snug fitting underwear, and avoiding sexual activity for at least 1 month.  Return precautions discussed extensively.  RTC 1 month repeat physical exam, sooner if problems or changes.  Legrand Rams, MD 03/20/2021  [facility] [address] [place], [place] [address] [phone]

## 2021-04-17 ENCOUNTER — Other Ambulatory Visit (INDEPENDENT_AMBULATORY_CARE_PROVIDER_SITE_OTHER): Payer: Self-pay | Admitting: Internal Medicine

## 2021-04-18 ENCOUNTER — Ambulatory Visit: Payer: 59 | Admitting: Urology

## 2021-05-09 ENCOUNTER — Encounter (INDEPENDENT_AMBULATORY_CARE_PROVIDER_SITE_OTHER): Payer: Self-pay

## 2021-08-12 ENCOUNTER — Ambulatory Visit (INDEPENDENT_AMBULATORY_CARE_PROVIDER_SITE_OTHER): Payer: 59 | Admitting: Internal Medicine

## 2022-06-04 ENCOUNTER — Emergency Department (HOSPITAL_COMMUNITY)
Admission: EM | Admit: 2022-06-04 | Discharge: 2022-06-04 | Disposition: A | Discharge status: Home / Self Care | Payer: Worker's Compensation | Attending: Emergency Medicine | Admitting: Emergency Medicine

## 2022-06-04 ENCOUNTER — Emergency Department (HOSPITAL_COMMUNITY): Payer: Worker's Compensation

## 2022-06-04 ENCOUNTER — Other Ambulatory Visit: Payer: Self-pay

## 2022-06-04 ENCOUNTER — Encounter (HOSPITAL_COMMUNITY): Payer: Self-pay | Admitting: *Deleted

## 2022-06-04 ENCOUNTER — Telehealth: Payer: Self-pay

## 2022-06-04 DIAGNOSIS — M2392 Unspecified internal derangement of left knee: Secondary | ICD-10-CM | POA: Insufficient documentation

## 2022-06-04 DIAGNOSIS — M25562 Pain in left knee: Secondary | ICD-10-CM | POA: Diagnosis present

## 2022-06-04 DIAGNOSIS — X501XXA Overexertion from prolonged static or awkward postures, initial encounter: Secondary | ICD-10-CM | POA: Insufficient documentation

## 2022-06-04 HISTORY — DX: Rheumatoid arthritis, unspecified: M06.9

## 2022-06-04 MED ORDER — IBUPROFEN 600 MG PO TABS: 600.0000 mg | ORAL_TABLET | Freq: Three times a day (TID) | ORAL | 0 refills | Status: DC

## 2022-06-04 MED ORDER — HYDROCODONE-ACETAMINOPHEN 5-325 MG PO TABS: 1.0000 | ORAL_TABLET | Freq: Four times a day (QID) | ORAL | 0 refills | Status: DC | PRN

## 2022-06-04 NOTE — ED Notes (Signed)
ED Provider at bedside. 

## 2022-06-04 NOTE — Discharge Instructions (Signed)
Continue to ice and elevate your knee is much as is comfortable.  Wear the knee immobilizer for any ambulation and weight bearing.  You have been provided crutches as well to assist you with movement.  You have been prescribed ibuprofen which should help with pain and inflammation, the hydrocodone will make you drowsy, do not drive within 4 hours of taking this medication.  As discussed, our on-call orthopedist is Dr. Romeo Apple, however with the work related injury issue, it would be in your best interest to follow-up with the orthopedist referred by your employer.

## 2022-06-04 NOTE — Telephone Encounter (Signed)
Patient left message wanting to schedule an appointment with Dr. Romeo Apple [facility] ER FOL/UP). Please review and let me know where to schedule.  Please advise

## 2022-06-04 NOTE — ED Provider Notes (Signed)
[facility] EMERGENCY DEPARTMENT Provider Note   CSN: [id number] Arrival date & time: 06/04/22  1257     History  Chief Complaint  Patient presents with   Knee Injury    Marvin Mcmahon is a 41 y.o. male presenting with sudden onset of left knee pain with an inversion type injury occurring yesterday.  He tripped stepping over an irrigation system, did not fall, but inverted the left knee during the event.  He has had significant pain at the medial knee since the event.  He has a soft knee brace which he presents with but still feels unstable with weight bearing and walking.  Has used ice for swelling, also took tylenol and gabapentin (borrowed) this am with some pain improvement.  This is a work related injury.   The history is provided by the patient.       Home Medications Prior to Admission medications   Medication Sig Start Date End Date Taking? Authorizing Provider  HYDROcodone-acetaminophen (NORCO/VICODIN) 5-325 MG tablet Take 1 tablet by mouth every 6 (six) hours as needed. 06/04/22  Yes Ryu Cerreta, Raynelle Fanning, PA-C  ibuprofen (ADVIL) 600 MG tablet Take 1 tablet (600 mg total) by mouth 3 (three) times daily. 06/04/22  Yes Pocahontas Cohenour, Raynelle Fanning, PA-C  Cholecalciferol (VITAMIN D-3) 125 MCG (5000 UT) TABS Take 2 tablets by mouth daily.    [provider]  testosterone cypionate (DEPOTESTOSTERONE CYPIONATE) 200 MG/ML injection Inject 0.6 mLs (120 mg total) into the muscle 2 (two) times a week. 04/18/21   Wilson Singer, MD  valACYclovir (VALTREX) 500 MG tablet Take 1 tablet (500 mg total) by mouth 2 (two) times daily. 05/28/20   Wilson Singer, MD      Allergies    Patient has no known allergies.    Review of Systems   Review of Systems  Constitutional:  Negative for fever.  Musculoskeletal:  Positive for arthralgias and joint swelling. Negative for myalgias.  Neurological:  Negative for weakness and numbness.  All other systems reviewed and are negative.   Physical  Exam Updated Vital Signs BP 122/73 (BP Location: Right Arm)   Pulse 83   Temp 98 F (36.7 C) (Oral)   Resp 16   Ht 6' (1.829 m)   Wt 90.7 kg   SpO2 96%   BMI 27.12 kg/m  Physical Exam Constitutional:      Appearance: He is well-developed.  HENT:     Head: Atraumatic.  Cardiovascular:     Comments: Pulses equal bilaterally Musculoskeletal:        General: Tenderness present.     Cervical back: Normal range of motion.     Left knee: Bony tenderness present. No deformity, effusion, erythema or ecchymosis. Decreased range of motion. No LCL laxity, MCL laxity, ACL laxity or PCL laxity.    Instability Tests: Anterior drawer test negative. Posterior drawer test negative.     Comments: TTP medial knee joint space, left without any obvious meniscal disruption.  Pain worsens with attempts to fully extend the knee.  Skin:    General: Skin is warm and dry.  Neurological:     Mental Status: He is alert.     Sensory: No sensory deficit.     Motor: No weakness.     Deep Tendon Reflexes: Reflexes normal.     ED Results / Procedures / Treatments   Labs (all labs ordered are listed, but only abnormal results are displayed) Labs Reviewed - No data to display  EKG None  Radiology DG Knee Complete 4 Views Left  Result Date: 06/04/2022 CLINICAL DATA:  Tripped yesterday. Felt knee turned inward. Medial pain and swelling. EXAM: LEFT KNEE - COMPLETE 4+ VIEW COMPARISON:  None Available. FINDINGS: Normal bone mineralization. Joint spaces are preserved. Minimal superior greater than inferior patellar degenerative osteophytosis. No joint effusion. No acute fracture is seen. No dislocation. IMPRESSION: Minimal patellar degenerative osteophytes.  No joint effusion. Electronically Signed   By: Neita Garnet M.D.   On: 06/04/2022 13:36    Procedures Procedures    Medications Ordered in ED Medications - No data to display  ED Course/ Medical Decision Making/ A&P                            Medical Decision Making Pt with internal derangement injury left knee, normal imaging.  Suspect meniscal injury, no medial or lateral collateral ligament pain or laxity noted. RICE, ibuprofen, hydrocodone prescribed.  Supplied crutches, full knee immobilizer.  Ortho f/u as planned.  Amount and/or Complexity of Data Reviewed Radiology: ordered and independent interpretation performed.    Details: Neg acute findings.   Risk Prescription drug management.           Final Clinical Impression(s) / ED Diagnoses Final diagnoses:  Knee derangement, left    Rx / DC Orders ED Discharge Orders          Ordered    ibuprofen (ADVIL) 600 MG tablet  3 times daily        06/04/22 1421    HYDROcodone-acetaminophen (NORCO/VICODIN) 5-325 MG tablet  Every 6 hours PRN        06/04/22 1421              Burgess Amor, PA-C 06/04/22 1450    Gloris Manchester, MD 06/07/22 [phone]

## 2022-06-04 NOTE — ED Triage Notes (Signed)
Pt c/o left knee pain after injury yesterday while at work. 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 Pt reports he felt like his knee turned inside during the injury.

## 2022-06-04 NOTE — ED Notes (Signed)
Pt verbalized understanding of no driving and to use caution within 4 hours of taking pain meds due to meds cause drowsiness 

## 2022-06-05 NOTE — Telephone Encounter (Signed)
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 Aware appointment is pending worker's comp approval and all information, as just discussed. Patient said he will call us with any information he receives.

## 2022-06-05 NOTE — Telephone Encounter (Signed)
Patient left a voice message while office was not in clinic relaying he has some information from worker's comp.

## 2022-06-06 ENCOUNTER — Encounter: Payer: Self-pay | Admitting: Orthopedic Surgery

## 2022-06-06 ENCOUNTER — Telehealth: Payer: Self-pay | Admitting: Radiology

## 2022-06-06 ENCOUNTER — Ambulatory Visit (INDEPENDENT_AMBULATORY_CARE_PROVIDER_SITE_OTHER): Payer: Worker's Compensation | Admitting: Orthopedic Surgery

## 2022-06-06 VITALS — BP 148/82 | HR 82 | Ht 72.0 in | Wt 208.0 lb

## 2022-06-06 DIAGNOSIS — S83242A Other tear of medial meniscus, current injury, left knee, initial encounter: Secondary | ICD-10-CM

## 2022-06-06 DIAGNOSIS — S83412A Sprain of medial collateral ligament of left knee, initial encounter: Secondary | ICD-10-CM | POA: Diagnosis not present

## 2022-06-06 MED ORDER — IBUPROFEN 800 MG PO TABS: 800.0000 mg | ORAL_TABLET | Freq: Three times a day (TID) | ORAL | 1 refills | Status: DC | PRN

## 2022-06-06 NOTE — Telephone Encounter (Signed)
I need to send information to [employer] Whats the information? I need adjustor phone fax and name if possible Thanks.  Its not available for me in media and or documents, thanks

## 2022-06-06 NOTE — Progress Notes (Signed)
Chief Complaint  Patient presents with   Knee Injury    Right knee pain/injury DOI 06/03/22.  OTJI.  Mis stepped and knee was valgus stressed, immediately could not wtb.  Swelling now gone down some, was seen in ED with xrays, given knee immobilizer.  WBAT now with immobilizer.      HPI: 41 year old male injured at work tripped over a object on the ground.  He to the ER had x-rays that were negative he is having significant medial pain and trouble weightbearing and trouble bending his knee he has a history of rheumatoid arthritis by record but I do not see any rheumatoid arthritis medications for him.  This is a work-related injury    Past Medical History:  Diagnosis Date   Rheumatoid arthritis (HCC)     BP (!) 148/82   Pulse 82   Ht 6' (1.829 m)   Wt 208 lb (94.3 kg)   BMI 28.21 kg/m    General appearance: Well-developed well-nourished no gross deformities  Cardiovascular normal pulse and perfusion normal color without edema  Neurologically no sensation loss or deficits or pathologic reflexes  Psychological: Awake alert and oriented x3 mood and affect normal  Skin no lacerations or ulcerations no nodularity no palpable masses, no erythema or nodularity  Musculoskeletal: Left knee medial pain medial tenderness small joint effusion extension 5 degrees flexion 90 degrees ACL PCL feel intact collateral ligament medial feels lax grade 1 there is also some meniscal signs as well  Imaging 4 views x-rays at the hospital AP lateral internal/external oblique no fracture dislocation, osteophytes referred to on x-ray by radiology not appreciated   A/P  Encounter Diagnoses  Name Primary?   Acute medial meniscus tear of left knee, initial encounter Yes   Sprain of medial collateral ligament of left knee, initial encounter      Differential diagnosis medial collateral ligament versus medial meniscus tear left knee  Recommend ice, ibuprofen, hinged knee bracing, weightbearing as  tolerated, active range of motion exercises,  Out of work until MRI is complete  Meds ordered this encounter  Medications   ibuprofen (ADVIL) 800 MG tablet    Sig: Take 1 tablet (800 mg total) by mouth every 8 (eight) hours as needed.    Dispense:  90 tablet    Refill:  1     Follow up after MRI

## 2022-06-06 NOTE — Addendum Note (Signed)
Addended byCaffie Damme on: 06/06/2022 10:34 AM   Modules accepted: Orders

## 2022-06-06 NOTE — Patient Instructions (Signed)
Recommend ice, ibuprofen, hinged knee bracing, weightbearing as tolerated, active range of motion exercises  No work until MRI results have been read by the doctor and patient has been reevaluated

## 2022-06-06 NOTE — Telephone Encounter (Signed)
Voice message received this morning, 06/06/22, day of patient's appointment, from [facility] at [facility], [place] [address], stating she has billing information, which was not left on the voice mail. She provided claim number, which patient was also given, for date of injury 06/03/22, Claim# [id number]. Per our administrator, Toniann Fail, patient aware of appointment scheduled today, 06/06/22, with Dr Romeo Apple.

## 2022-06-10 ENCOUNTER — Telehealth: Payer: Self-pay

## 2022-06-10 NOTE — Telephone Encounter (Signed)
Toniann Fail told me to send this to the assistant.  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 Melissa said so they could get the MRI approved.

## 2022-06-23 ENCOUNTER — Ambulatory Visit (INDEPENDENT_AMBULATORY_CARE_PROVIDER_SITE_OTHER): Payer: Worker's Compensation | Admitting: Orthopedic Surgery

## 2022-06-23 ENCOUNTER — Encounter: Payer: Self-pay | Admitting: Orthopedic Surgery

## 2022-06-23 DIAGNOSIS — S83242D Other tear of medial meniscus, current injury, left knee, subsequent encounter: Secondary | ICD-10-CM

## 2022-06-23 NOTE — Progress Notes (Unsigned)
Chief Complaint  Patient presents with   Follow-up    Recheck on left knee   He was injured at work   He s here for MRI results and to re eval his left knee.  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  I would call this overcall by the reading radiologist.  Clinical features would be necessary to determine whether this is something of clinical importance

## 2022-06-23 NOTE — Patient Instructions (Signed)
RTW tomorrow

## 2022-07-07 ENCOUNTER — Encounter: Payer: Self-pay | Admitting: Orthopedic Surgery

## 2022-07-07 ENCOUNTER — Ambulatory Visit (INDEPENDENT_AMBULATORY_CARE_PROVIDER_SITE_OTHER): Payer: Worker's Compensation | Admitting: Orthopedic Surgery

## 2022-07-07 VITALS — Ht 73.0 in | Wt 205.0 lb

## 2022-07-07 DIAGNOSIS — M25562 Pain in left knee: Secondary | ICD-10-CM | POA: Diagnosis not present

## 2022-07-07 MED ORDER — INDOMETHACIN 25 MG PO CAPS: 25.0000 mg | ORAL_CAPSULE | Freq: Every day | ORAL | 2 refills | Status: DC

## 2022-07-07 NOTE — Patient Instructions (Signed)
Smoking Tobacco Information, Adult Smoking tobacco can be harmful to your health. Tobacco contains a toxic colorless chemical called nicotine. Nicotine causes changes in your brain that make you want more and more. This is called addiction. This can make it hard to stop smoking once you start. Tobacco also has other toxic chemicals that can hurt your body and raise your risk of many cancers. Menthol or "lite" tobacco or cigarette brands are not safer than regular brands. How can smoking tobacco affect me? Smoking tobacco puts you at risk for: Cancer. Smoking is most commonly associated with lung cancer, but can also lead to cancer in other parts of the body. Chronic obstructive pulmonary disease (COPD). This is a long-term lung condition that makes it hard to breathe. It also gets worse over time. High blood pressure (hypertension), heart disease, stroke, heart attack, and lung infections, such as pneumonia. Cataracts. This is when the lenses in the eyes become clouded. Digestive problems. This may include peptic ulcers, heartburn, and gastroesophageal reflux disease (GERD). Oral health problems, such as gum disease, mouth sores, and tooth loss. Loss of taste and smell. Smoking also affects how you look and smell. Smoking may cause: Wrinkles. Yellow or stained teeth, fingers, and fingernails. Bad breath. Bad-smelling clothes and hair. Smoking tobacco can also affect your social life, because: It may be challenging to find places to smoke when away from home. Many workplaces, restaurants, hotels, and public places are tobacco-free. Smoking is expensive. This is due to the cost of tobacco and the long-term costs of treating health problems from smoking. Secondhand smoke may affect those around you. Secondhand smoke can cause lung cancer, breathing problems, and heart disease. Children of smokers have a higher risk for: Sudden infant death syndrome (SIDS). Ear infections. Lung infections. What  actions can I take to prevent health problems? Quit smoking  Do not start smoking. Quit if you already smoke. Do not replace cigarette smoking with vaping devices, such as e-cigarettes. Make a plan to quit smoking and commit to it. Look for programs to help you, and ask your health care provider for recommendations and ideas. Set a date and write down all the reasons you want to quit. Let your friends and family know you are quitting so they can help and support you. Consider finding friends who also want to quit. It can be easier to quit with someone else, so that you can support each other. Talk with your health care provider about using nicotine replacement medicines to help you quit. These include gum, lozenges, patches, sprays, or pills. If you try to quit but return to smoking, stay positive. It is common to slip up when you first quit, so take it one day at a time. Be prepared for cravings. When you feel the urge to smoke, chew gum or suck on hard candy. Lifestyle Stay busy. Take care of your body. Get plenty of exercise, eat a healthy diet, and drink plenty of water. Find ways to manage your stress, such as meditation, yoga, exercise, or time spent with friends and family. Ask your health care provider about having regular tests (screenings) to check for cancer. This may include blood tests, imaging tests, and other tests. Where to find support To get support to quit smoking, consider: Asking your health care provider for more information and resources. Joining a support group for people who want to quit smoking in your local community. There are many effective programs that may help you to quit. Calling the smokefree.gov counselor   helpline at 1-800-QUIT-NOW (1-800-784-8669). Where to find more information You may find more information about quitting smoking from: Centers for Disease Control and Prevention: cdc.gov/tobacco Smokefree.gov: smokefree.gov American Lung Association:  freedomfromsmoking.org Contact a health care provider if: You have problems breathing. Your lips, nose, or fingers turn blue. You have chest pain. You are coughing up blood. You feel like you will faint. You have other health changes that cause you to worry. Summary Smoking tobacco can negatively affect your health, the health of those around you, your finances, and your social life. Do not start smoking. Quit if you already smoke. If you need help quitting, ask your health care provider. Consider joining a support group for people in your local community who want to quit smoking. There are many effective programs that may help you to quit. This information is not intended to replace advice given to you by your health care provider. Make sure you discuss any questions you have with your health care provider. Document Revised: 09/17/2021 Document Reviewed: 09/17/2021 Elsevier Patient Education  2023 Elsevier Inc.  

## 2022-07-07 NOTE — Progress Notes (Signed)
Chief Complaint  Patient presents with   Knee Pain    L/ its doing ok, still having like nerve pain with the burning sensation, pins and needles feeling.   This is a follow-up for Volney he was injured in a work-related accident his left knee is improving we had an MRI it had an old ACL tear and a bone contusion over the medial femoral condyle grade III chondromalacia of the trochlea  We decided to continue with nonoperative care  He is wearing a knee sleeve at work he was able to return to work over the last 2 weeks and is doing reasonably well avoiding any unequal footing or ground avoiding any potholes as best he can  He does note some increased discomfort if his knee is flexed and internally rotated  He should do well over the next few weeks as long as he does not have a reinjury  Ibuprofen and Tylenol have not been controlling the burning so we can try him on Indocin once a day  Meds ordered this encounter  Medications   indomethacin (INDOCIN) 25 MG capsule    Sig: Take 1 capsule (25 mg total) by mouth daily.    Dispense:  30 capsule    Refill:  2    I think we can keep his is open for 2 years if he has any issues it might be 3 in [place] not sure but definitely to he is advised to call us or come back if anything does not feel right

## 2022-10-09 ENCOUNTER — Encounter: Payer: Self-pay | Admitting: Orthopedic Surgery

## 2022-12-04 ENCOUNTER — Encounter: Payer: Self-pay | Admitting: Radiology

## 2023-06-02 ENCOUNTER — Other Ambulatory Visit: Payer: Self-pay | Admitting: Orthopedic Surgery

## 2023-06-02 DIAGNOSIS — S83242A Other tear of medial meniscus, current injury, left knee, initial encounter: Secondary | ICD-10-CM

## 2023-06-02 DIAGNOSIS — S83412A Sprain of medial collateral ligament of left knee, initial encounter: Secondary | ICD-10-CM

## 2023-07-07 ENCOUNTER — Ambulatory Visit: Payer: Self-pay

## 2023-07-07 ENCOUNTER — Ambulatory Visit
Admission: EM | Admit: 2023-07-07 | Discharge: 2023-07-07 | Disposition: A | Discharge status: Home / Self Care | Payer: Managed Care, Other (non HMO) | Attending: Family Medicine | Admitting: Family Medicine

## 2023-07-07 DIAGNOSIS — J22 Unspecified acute lower respiratory infection: Secondary | ICD-10-CM | POA: Diagnosis not present

## 2023-07-07 DIAGNOSIS — R062 Wheezing: Secondary | ICD-10-CM

## 2023-07-07 DIAGNOSIS — R0602 Shortness of breath: Secondary | ICD-10-CM | POA: Diagnosis not present

## 2023-07-07 MED ORDER — PREDNISONE 20 MG PO TABS: 40.0000 mg | ORAL_TABLET | Freq: Every day | ORAL | 0 refills | Status: DC

## 2023-07-07 MED ORDER — IPRATROPIUM-ALBUTEROL 0.5-2.5 (3) MG/3ML IN SOLN
3.0000 mL | Freq: Once | RESPIRATORY_TRACT | Status: AC
  Administered 2023-07-07: 3 mL via RESPIRATORY_TRACT

## 2023-07-07 MED ORDER — AMOXICILLIN-POT CLAVULANATE 875-125 MG PO TABS: 1.0000 | ORAL_TABLET | Freq: Two times a day (BID) | ORAL | 0 refills | Status: DC

## 2023-07-07 MED ORDER — PROMETHAZINE-DM 6.25-15 MG/5ML PO SYRP: 5.0000 mL | ORAL_SOLUTION | Freq: Four times a day (QID) | ORAL | 0 refills | Status: DC | PRN

## 2023-07-07 MED ORDER — ALBUTEROL SULFATE HFA 108 (90 BASE) MCG/ACT IN AERS: 2.0000 | INHALATION_SPRAY | RESPIRATORY_TRACT | 0 refills | Status: DC | PRN

## 2023-07-07 NOTE — ED Provider Notes (Signed)
[facility]    CSN: [id number] Arrival date & time: 07/07/23  1134      History   Chief Complaint No chief complaint on file.   HPI Marvin Mcmahon is a 42 y.o. male.   Patient resenting today with 1 week history of initially cough, congestion, sore throat and now progressively worsening chest congestion, crackles, shortness of breath, chest tightness, wheezing.  Denies known fever, body aches, chest pain, abdominal pain, nausea vomiting or diarrhea.  Taking over-the-counter cold congestion medication with minimal relief.  History of pneumonia.  No known chronic pulmonary conditions.    Past Medical History:  Diagnosis Date   Rheumatoid arthritis (HCC)     There are no problems to display for this patient.   Past Surgical History:  Procedure Laterality Date   CYST EXCISION     On face       Home Medications    Prior to Admission medications   Medication Sig Start Date End Date Taking? Authorizing Provider  albuterol (VENTOLIN HFA) 108 (90 Base) MCG/ACT inhaler Inhale 2 puffs into the lungs every 4 (four) hours as needed. 07/07/23  Yes Particia Nearing, PA-C  amoxicillin-clavulanate (AUGMENTIN) 875-125 MG tablet Take 1 tablet by mouth every 12 (twelve) hours. 07/07/23  Yes Particia Nearing, PA-C  predniSONE (DELTASONE) 20 MG tablet Take 2 tablets (40 mg total) by mouth daily with breakfast. 07/07/23  Yes Particia Nearing, PA-C  promethazine-dextromethorphan (PROMETHAZINE-DM) 6.25-15 MG/5ML syrup Take 5 mLs by mouth 4 (four) times daily as needed. 07/07/23  Yes Particia Nearing, PA-C  Cholecalciferol (VITAMIN D-3) 125 MCG (5000 UT) TABS Take 2 tablets by mouth daily.    [provider]  HYDROcodone-acetaminophen (NORCO/VICODIN) 5-325 MG tablet Take 1 tablet by mouth every 6 (six) hours as needed. 06/04/22   Burgess Amor, PA-C  ibuprofen (ADVIL) 800 MG tablet Take 1 tablet (800 mg total) by mouth every 8 (eight) hours as needed.  06/06/22   Vickki Hearing, MD  indomethacin (INDOCIN) 25 MG capsule Take 1 capsule (25 mg total) by mouth daily. 07/07/22   Vickki Hearing, MD  testosterone cypionate (DEPOTESTOSTERONE CYPIONATE) 200 MG/ML injection Inject 0.6 mLs (120 mg total) into the muscle 2 (two) times a week. 04/18/21   Wilson Singer, MD  valACYclovir (VALTREX) 500 MG tablet Take 1 tablet (500 mg total) by mouth 2 (two) times daily. 05/28/20   Wilson Singer, MD    Family History Family History  Problem Relation Age of Onset   Hypertension Mother    Arthritis Father    Bladder Cancer Neg Hx    Kidney cancer Neg Hx    Prostate cancer Neg Hx     Social History Social History   Tobacco Use   Smoking status: Every Day    Current packs/day: 0.50    Types: Cigarettes   Smokeless tobacco: Never  Vaping Use   Vaping status: Former  Substance Use Topics   Alcohol use: Yes    Alcohol/week: 2.0 standard drinks of alcohol    Types: 2 Cans of beer per week    Comment: Occasional during week   Drug use: Not Currently    Types: Marijuana, Hydrocodone     Allergies   Patient has no known allergies.   Review of Systems Review of Systems Per HPI  Physical Exam Triage Vital Signs ED Triage Vitals  Encounter Vitals Group     BP 07/07/23 1144 (!) 161/93  Systolic BP Percentile --      Diastolic BP Percentile --      Pulse Rate 07/07/23 1144 93     Resp 07/07/23 1144 15     Temp 07/07/23 1144 98.4 F (36.9 C)     Temp Source 07/07/23 1144 Oral     SpO2 07/07/23 1144 93 %     Weight --      Height --      Head Circumference --      Peak Flow --      Pain Score 07/07/23 1147 0     Pain Loc --      Pain Education --      Exclude from Growth Chart --    No data found.  Updated Vital Signs BP (!) 161/93 (BP Location: Right Arm)   Pulse 93   Temp 98.4 F (36.9 C) (Oral)   Resp 15   SpO2 93%   Visual Acuity Right Eye Distance:   Left Eye Distance:   Bilateral Distance:     Right Eye Near:   Left Eye Near:    Bilateral Near:     Physical Exam Vitals and nursing note reviewed.  Constitutional:      Appearance: He is well-developed.  HENT:     Head: Atraumatic.     Right Ear: External ear normal.     Left Ear: External ear normal.     Nose: Congestion present.     Mouth/Throat:     Pharynx: Posterior oropharyngeal erythema present. No oropharyngeal exudate.  Eyes:     Conjunctiva/sclera: Conjunctivae normal.     Pupils: Pupils are equal, round, and reactive to light.  Cardiovascular:     Rate and Rhythm: Normal rate and regular rhythm.  Pulmonary:     Effort: Pulmonary effort is normal. No respiratory distress.     Breath sounds: Wheezing and rales present.  Musculoskeletal:        General: Normal range of motion.     Cervical back: Normal range of motion and neck supple.  Lymphadenopathy:     Cervical: No cervical adenopathy.  Skin:    General: Skin is warm and dry.  Neurological:     Mental Status: He is alert and oriented to person, place, and time.  Psychiatric:        Behavior: Behavior normal.      UC Treatments / Results  Labs (all labs ordered are listed, but only abnormal results are displayed) Labs Reviewed - No data to display  EKG   Radiology DG Chest 2 View  Result Date: 07/07/2023 CLINICAL DATA:  Productive cough, shortness of breath, and wheezing for 1 week. EXAM: CHEST - 2 VIEW COMPARISON:  05/21/2015 FINDINGS: The heart size and mediastinal contours are within normal limits. Both lungs are clear. The visualized skeletal structures are unremarkable. IMPRESSION: No active cardiopulmonary disease. Electronically Signed   By: Danae Orleans M.D.   On: 07/07/2023 13:39    Procedures Procedures (including critical care time)  Medications Ordered in UC Medications  ipratropium-albuterol (DUONEB) 0.5-2.5 (3) MG/3ML nebulizer solution 3 mL (3 mLs Nebulization Given 07/07/23 1205)    Initial Impression / Assessment and  Plan / UC Course  I have reviewed the triage vital signs and the nursing notes.  Pertinent labs & imaging results that were available during my care of the patient were reviewed by me and considered in my medical decision making (see chart for details).     Hypertensive in triage,  oxygen saturation 93% on room air.  He is overall well-appearing and in no acute distress, however lungs with bilateral wheezes, crackles.  Mild improvement with DuoNeb treatment in clinic, chest x-ray negative for pneumonia however given worsening course and exam findings will cover with Augmentin, prednisone, Phenergan DM, albuterol.  Supportive over-the-counter medications and home care.  Final Clinical Impressions(s) / UC Diagnoses   Final diagnoses:  Lower respiratory infection  Wheezing  SOB (shortness of breath)     Discharge Instructions      I will call you if your chest x-ray comes back abnormal.  I have sent over medications, follow-up if you are not getting significantly better over the next few days    ED Prescriptions     Medication Sig Dispense Auth. Provider   amoxicillin-clavulanate (AUGMENTIN) 875-125 MG tablet Take 1 tablet by mouth every 12 (twelve) hours. 14 tablet Particia Nearing, [place]   predniSONE (DELTASONE) 20 MG tablet Take 2 tablets (40 mg total) by mouth daily with breakfast. 10 tablet Particia Nearing, PA-C   albuterol (VENTOLIN HFA) 108 (90 Base) MCG/ACT inhaler Inhale 2 puffs into the lungs every 4 (four) hours as needed. 18 g Roosvelt Maser [place], [place]   promethazine-dextromethorphan (PROMETHAZINE-DM) 6.25-15 MG/5ML syrup Take 5 mLs by mouth 4 (four) times daily as needed. 100 mL Particia Nearing, [place]      PDMP not reviewed this encounter.   Particia Nearing, [place] 07/07/23 1356

## 2023-07-07 NOTE — ED Triage Notes (Addendum)
Pt c/o cough congestion and sore throat x 1 week, majority of symptoms have subsided but the cough is still lingering and pt can feel it in his chest. Along with dizziness and SOB

## 2023-07-07 NOTE — Discharge Instructions (Signed)
I will call you if your chest x-ray comes back abnormal.  I have sent over medications, follow-up if you are not getting significantly better over the next few days

## 2024-09-07 ENCOUNTER — Ambulatory Visit
Admission: EM | Admit: 2024-09-07 | Discharge: 2024-09-07 | Disposition: A | Discharge status: Home / Self Care | Payer: Self-pay | Attending: Family Medicine | Admitting: Family Medicine

## 2024-09-07 ENCOUNTER — Encounter: Payer: Self-pay | Admitting: Emergency Medicine

## 2024-09-07 DIAGNOSIS — J208 Acute bronchitis due to other specified organisms: Secondary | ICD-10-CM

## 2024-09-07 DIAGNOSIS — R030 Elevated blood-pressure reading, without diagnosis of hypertension: Secondary | ICD-10-CM

## 2024-09-07 MED ORDER — PROMETHAZINE-DM 6.25-15 MG/5ML PO SYRP: 5.0000 mL | ORAL_SOLUTION | Freq: Four times a day (QID) | ORAL | 0 refills | Status: AC | PRN

## 2024-09-07 MED ORDER — ALBUTEROL SULFATE HFA 108 (90 BASE) MCG/ACT IN AERS: 2.0000 | INHALATION_SPRAY | RESPIRATORY_TRACT | 0 refills | Status: AC | PRN

## 2024-09-07 MED ORDER — DEXAMETHASONE SOD PHOSPHATE PF 10 MG/ML IJ SOLN
10.0000 mg | Freq: Once | INTRAMUSCULAR | Status: AC
  Administered 2024-09-07: 10 mg via INTRAMUSCULAR

## 2024-09-07 NOTE — ED Provider Notes (Signed)
 [facility]    CSN: [id number] Arrival date & time: 09/07/24  1833      History   Chief Complaint No chief complaint on file.   HPI Marvin Mcmahon is a 43 y.o. male.   Today with progressively worsening hacking cough, congestion for the past week to 2 weeks.  Denies fever, chills, chest pain, shortness of breath, abdominal pain, vomiting, diarrhea.  States symptoms are significantly worse when he is working outside in the cold.  So far trying over-the-counter cold and congestion medication with minimal relief.  No known history of chronic pulmonary disease.    Past Medical History:  Diagnosis Date   Rheumatoid arthritis (HCC)     There are no active problems to display for this patient.   Past Surgical History:  Procedure Laterality Date   CYST EXCISION     On face       Home Medications    Prior to Admission medications   Medication Sig Start Date End Date Taking? Authorizing Provider  albuterol  (VENTOLIN  HFA) 108 (90 Base) MCG/ACT inhaler Inhale 2 puffs into the lungs every 4 (four) hours as needed. 09/07/24  Yes Stuart Vernell Norris, PA-C  promethazine -dextromethorphan (PROMETHAZINE -DM) 6.25-15 MG/5ML syrup Take 5 mLs by mouth 4 (four) times daily as needed. 09/07/24  Yes Stuart Vernell Norris, PA-C  Cholecalciferol (VITAMIN D -3) 125 MCG (5000 UT) TABS Take 2 tablets by mouth daily.    [provider]  testosterone  cypionate (DEPOTESTOSTERONE CYPIONATE) 200 MG/ML injection Inject 0.6 mLs (120 mg total) into the muscle 2 (two) times a week. 04/18/21   Gosrani, Nimish C, MD  valACYclovir  (VALTREX ) 500 MG tablet Take 1 tablet (500 mg total) by mouth 2 (two) times daily. 05/28/20   Caswell Ronnell BROCKS, MD    Family History Family History  Problem Relation Age of Onset   Hypertension Mother    Arthritis Father    Bladder Cancer Neg Hx    Kidney cancer Neg Hx    Prostate cancer Neg Hx     Social History Social History   Tobacco Use    Smoking status: Every Day    Current packs/day: 0.50    Types: Cigarettes   Smokeless tobacco: Never  Vaping Use   Vaping status: Former  Substance Use Topics   Alcohol use: Yes    Alcohol/week: 2.0 standard drinks of alcohol    Types: 2 Cans of beer per week    Comment: Occasional during week   Drug use: Not Currently    Types: Marijuana, Hydrocodone      Allergies   Patient has no known allergies.   Review of Systems Review of Systems Per HPI  Physical Exam Triage Vital Signs ED Triage Vitals  Encounter Vitals Group     BP 09/07/24 1838 (!) 155/90     Girls Systolic BP Percentile --      Girls Diastolic BP Percentile --      Boys Systolic BP Percentile --      Boys Diastolic BP Percentile --      Pulse Rate 09/07/24 1838 93     Resp 09/07/24 1838 18     Temp 09/07/24 1838 98.3 F (36.8 C)     Temp Source 09/07/24 1838 Oral     SpO2 09/07/24 1838 95 %     Weight --      Height --      Head Circumference --      Peak Flow --  Pain Score 09/07/24 1840 3     Pain Loc --      Pain Education --      Exclude from Growth Chart --    No data found.  Updated Vital Signs BP (!) 155/90 (BP Location: Right Arm)   Pulse 93   Temp 98.3 F (36.8 C) (Oral)   Resp 18   SpO2 95%   Visual Acuity Right Eye Distance:   Left Eye Distance:   Bilateral Distance:    Right Eye Near:   Left Eye Near:    Bilateral Near:     Physical Exam Vitals and nursing note reviewed.  Constitutional:      Appearance: He is well-developed.  HENT:     Head: Atraumatic.     Right Ear: Tympanic membrane and external ear normal.     Left Ear: Tympanic membrane and external ear normal.     Nose: Rhinorrhea present.     Mouth/Throat:     Pharynx: Posterior oropharyngeal erythema present. No oropharyngeal exudate.  Eyes:     Conjunctiva/sclera: Conjunctivae normal.     Pupils: Pupils are equal, round, and reactive to light.  Cardiovascular:     Rate and Rhythm: Normal rate and  regular rhythm.  Pulmonary:     Effort: Pulmonary effort is normal. No respiratory distress.     Breath sounds: No wheezing or rales.  Musculoskeletal:        General: Normal range of motion.     Cervical back: Normal range of motion and neck supple.  Lymphadenopathy:     Cervical: No cervical adenopathy.  Skin:    General: Skin is warm and dry.  Neurological:     Mental Status: He is alert and oriented to person, place, and time.  Psychiatric:        Behavior: Behavior normal.      UC Treatments / Results  Labs (all labs ordered are listed, but only abnormal results are displayed) Labs Reviewed - No data to display  EKG   Radiology No results found.  Procedures Procedures (including critical care time)  Medications Ordered in UC Medications  dexamethasone (DECADRON) injection 10 mg (10 mg Intramuscular Given 09/07/24 1905)    Initial Impression / Assessment and Plan / UC Course  I have reviewed the triage vital signs and the nursing notes.  Pertinent labs & imaging results that were available during my care of the patient were reviewed by me and considered in my medical decision making (see chart for details).     Suspect viral bronchitis, treat with IM Decadron, albuterol , Phenergan  DM.  Blood pressure elevated today in triage, discussed safe over-the-counter medications that will not further elevate blood pressure.  Continue to monitor home readings and follow up for worsening sxs.  Final Clinical Impressions(s) / UC Diagnoses   Final diagnoses:  Viral bronchitis  Elevated blood pressure reading     Discharge Instructions      We have given you a steroid shot today and I have prescribed an albuterol  inhaler and cough syrup to be used as needed.  You may continue antihistamines, nasal sprays, over-the-counter cold and congestion medications as needed additionally.  Your blood pressure is a bit elevated today, medications over-the-counter such as Coricidin  HBP and plain Mucinex will not further elevate your blood pressure and may be your best options as far as the scope.    ED Prescriptions     Medication Sig Dispense Auth. Provider   albuterol  (VENTOLIN  HFA)  108 (90 Base) MCG/ACT inhaler Inhale 2 puffs into the lungs every 4 (four) hours as needed. 18 g Stuart Vernell Norris, PA-C   promethazine -dextromethorphan (PROMETHAZINE -DM) 6.25-15 MG/5ML syrup Take 5 mLs by mouth 4 (four) times daily as needed. 100 mL Stuart Vernell Norris, [place]      PDMP not reviewed this encounter.   Stuart Vernell Norris, [place] 09/07/24 1913

## 2024-09-07 NOTE — Discharge Instructions (Signed)
 We have given you a steroid shot today and I have prescribed an albuterol  inhaler and cough syrup to be used as needed.  You may continue antihistamines, nasal sprays, over-the-counter cold and congestion medications as needed additionally.  Your blood pressure is a bit elevated today, medications over-the-counter such as Coricidin HBP and plain Mucinex will not further elevate your blood pressure and may be your best options as far as the scope.

## 2024-09-07 NOTE — ED Triage Notes (Signed)
 Productive Cough and sore throat since before Thanksgiving.  Has been taking over the counter cold and flu medication without relief.

## 2024-09-20 ENCOUNTER — Ambulatory Visit
Admission: EM | Admit: 2024-09-20 | Discharge: 2024-09-20 | Disposition: A | Discharge status: Home / Self Care | Payer: Self-pay | Source: Home / Self Care

## 2024-09-20 ENCOUNTER — Encounter: Payer: Self-pay | Admitting: Emergency Medicine

## 2024-09-20 DIAGNOSIS — J029 Acute pharyngitis, unspecified: Secondary | ICD-10-CM

## 2024-09-20 DIAGNOSIS — R490 Dysphonia: Secondary | ICD-10-CM

## 2024-09-20 LAB — POCT RAPID STREP A (OFFICE): Rapid Strep A Screen: NEGATIVE

## 2024-09-20 MED ORDER — NYSTATIN 100000 UNIT/ML MT SUSP: 15.0000 mL | Freq: Four times a day (QID) | OROMUCOSAL | 0 refills | Status: AC | PRN

## 2024-09-20 NOTE — ED Provider Notes (Signed)
 [facility]    CSN: [id number] Arrival date & time: 09/20/24  1445      History   Chief Complaint No chief complaint on file.   HPI Marvin Mcmahon is a 43 y.o. male.   The history is provided by the patient.   Patient presents for a 10-day history of sore throat and hoarseness.  Patient states pain has remained around 3/10 since symptoms started.  He states that the hoarseness comes and goes.  Patient reports that he works outside for long hours, states that his symptoms worsen when he breathes in cold air.  Patient reports that he was recently treated for bronchitis.  So far, states he has been using over-the-counter cough drops for his symptoms.  Also states he has been wearing a mask over his mouth to help with the cold air.  He also endorses underlying history of seasonal allergies.  States that he does take Zyrtec  daily.  Past Medical History:  Diagnosis Date   Rheumatoid arthritis (HCC)     There are no active problems to display for this patient.   Past Surgical History:  Procedure Laterality Date   CYST EXCISION     On face       Home Medications    Prior to Admission medications  Medication Sig Start Date End Date Taking? Authorizing Provider  magic mouthwash (nystatin , hydrocortisone, diphenhydrAMINE, lidocaine) suspension Swish and swallow 15 mLs 4 (four) times daily as needed for mouth pain. 09/20/24  Yes Leath-Warren, Etta PARAS, NP  albuterol  (VENTOLIN  HFA) 108 (90 Base) MCG/ACT inhaler Inhale 2 puffs into the lungs every 4 (four) hours as needed. 09/07/24   Stuart Vernell Norris, PA-C  Cholecalciferol (VITAMIN D -3) 125 MCG (5000 UT) TABS Take 2 tablets by mouth daily.    [provider]  promethazine -dextromethorphan (PROMETHAZINE -DM) 6.25-15 MG/5ML syrup Take 5 mLs by mouth 4 (four) times daily as needed. 09/07/24   Stuart Vernell Norris, PA-C  testosterone  cypionate (DEPOTESTOSTERONE CYPIONATE) 200 MG/ML injection Inject 0.6 mLs  (120 mg total) into the muscle 2 (two) times a week. 04/18/21   Gosrani, Nimish C, MD  valACYclovir  (VALTREX ) 500 MG tablet Take 1 tablet (500 mg total) by mouth 2 (two) times daily. 05/28/20   Caswell Ronnell BROCKS, MD    Family History Family History  Problem Relation Age of Onset   Hypertension Mother    Arthritis Father    Bladder Cancer Neg Hx    Kidney cancer Neg Hx    Prostate cancer Neg Hx     Social History Social History[1]   Allergies   Patient has no known allergies.   Review of Systems Review of Systems Per HPI  Physical Exam Triage Vital Signs ED Triage Vitals [09/20/24 1513]  Encounter Vitals Group     BP 126/77     Girls Systolic BP Percentile      Girls Diastolic BP Percentile      Boys Systolic BP Percentile      Boys Diastolic BP Percentile      Pulse Rate 85     Resp 18     Temp 98.4 F (36.9 C)     Temp Source Oral     SpO2 97 %     Weight      Height      Head Circumference      Peak Flow      Pain Score 3     Pain Loc      Pain Education  Exclude from Growth Chart    No data found.  Updated Vital Signs BP 126/77 (BP Location: Right Arm)   Pulse 85   Temp 98.4 F (36.9 C) (Oral)   Resp 18   SpO2 97%   Visual Acuity Right Eye Distance:   Left Eye Distance:   Bilateral Distance:    Right Eye Near:   Left Eye Near:    Bilateral Near:     Physical Exam Vitals and nursing note reviewed.  Constitutional:      General: He is not in acute distress.    Appearance: Normal appearance.  HENT:     Head: Normocephalic.     Right Ear: Tympanic membrane, ear canal and external ear normal.     Left Ear: Tympanic membrane and external ear normal.     Nose: Nose normal.     Right Turbinates: Enlarged and swollen.     Left Turbinates: Enlarged and swollen.     Mouth/Throat:     Lips: Pink.     Mouth: Mucous membranes are moist.     Pharynx: Posterior oropharyngeal erythema and postnasal drip present. No pharyngeal swelling,  oropharyngeal exudate or uvula swelling.     Comments: Cobblestoning present to posterior oropharynx  Eyes:     Extraocular Movements: Extraocular movements intact.     Pupils: Pupils are equal, round, and reactive to light.  Cardiovascular:     Rate and Rhythm: Normal rate and regular rhythm.     Pulses: Normal pulses.     Heart sounds: Normal heart sounds.  Pulmonary:     Effort: Pulmonary effort is normal.     Breath sounds: Normal breath sounds.  Musculoskeletal:     Cervical back: Normal range of motion.  Skin:    General: Skin is warm and dry.  Neurological:     General: No focal deficit present.     Mental Status: He is alert and oriented to person, place, and time.  Psychiatric:        Mood and Affect: Mood normal.        Behavior: Behavior normal.      UC Treatments / Results  Labs (all labs ordered are listed, but only abnormal results are displayed) Labs Reviewed  POCT RAPID STREP A (OFFICE) - Normal  CULTURE, GROUP A STREP [facility])    EKG   Radiology No results found.  Procedures Procedures (including critical care time)  Medications Ordered in UC Medications - No data to display  Initial Impression / Assessment and Plan / UC Course  I have reviewed the triage vital signs and the nursing notes.  Pertinent labs & imaging results that were available during my care of the patient were reviewed by me and considered in my medical decision making (see chart for details).  The rapid strep test is negative, a throat culture is pending.  On exam, the patient is well-appearing, he is in no acute distress, vital signs are stable.  He complains of throat pain with hoarseness has been present for the past 10 days.  Patient has been afebrile, he denies any obvious close sick contacts.  Difficult to determine the origin of the patient's persistent throat pain at this time.  Will provide symptomatic treatment however with Magic mouthwash.  Supportive care recommendations  were provided and discussed with the patient to include over-the-counter analgesics, warm salt water gargles, voice rest, warm teas with honey and lemon, and Chloraseptic throat spray.  Patient was advised if symptoms fail to  improve with this treatment, and the throat culture result is negative, recommend follow-up with his primary care physician for further evaluation.  Patient was in agreement with this plan of care and verbalizes understanding.  All questions were answered.  Patient stable for discharge.  Work note was provided.  Final Clinical Impressions(s) / UC Diagnoses   Final diagnoses:  Sore throat  Hoarseness of voice     Discharge Instructions      The rapid strep test was negative. Take medication as prescribed. Continue use of your antihistamine daily. Increase fluids and allow for plenty of rest. Recommend warm salt water gargles 3-4 times daily as needed for throat pain or discomfort.  You may also use over-the-counter Chloraseptic throat spray or throat lozenges while symptoms persist. Rest your voice is much as possible to help decrease your recovery time. Also recommend warm teas with honey and lemon while symptoms persist. When you are out in cold temperatures, make sure you are wearing something that covers your mouth to prevent deep inhalation of cold air. As discussed, if your symptoms fail to improve, or begin to worsen, recommend follow-up with your primary care physician for further evaluation. Follow-up as needed.     ED Prescriptions     Medication Sig Dispense Auth. Provider   magic mouthwash (nystatin , hydrocortisone, diphenhydrAMINE, lidocaine) suspension Swish and swallow 15 mLs 4 (four) times daily as needed for mouth pain. 540 mL Leath-Warren, Etta PARAS, NP      PDMP not reviewed this encounter.    [1]  Social History Tobacco Use   Smoking status: Every Day    Current packs/day: 0.50    Types: Cigarettes   Smokeless tobacco: Never   Vaping Use   Vaping status: Former  Substance Use Topics   Alcohol use: Yes    Alcohol/week: 2.0 standard drinks of alcohol    Types: 2 Cans of beer per week    Comment: Occasional during week   Drug use: Not Currently    Types: Marijuana, Hydrocodone      Gilmer Etta PARAS, NP 09/20/24 1734

## 2024-09-20 NOTE — ED Triage Notes (Signed)
 Sore throat and lost voice x 10 days.

## 2024-09-20 NOTE — Discharge Instructions (Signed)
 The rapid strep test was negative. Take medication as prescribed. Continue use of your antihistamine daily. Increase fluids and allow for plenty of rest. Recommend warm salt water gargles 3-4 times daily as needed for throat pain or discomfort.  You may also use over-the-counter Chloraseptic throat spray or throat lozenges while symptoms persist. Rest your voice is much as possible to help decrease your recovery time. Also recommend warm teas with honey and lemon while symptoms persist. When you are out in cold temperatures, make sure you are wearing something that covers your mouth to prevent deep inhalation of cold air. As discussed, if your symptoms fail to improve, or begin to worsen, recommend follow-up with your primary care physician for further evaluation. Follow-up as needed.

## 2024-09-23 ENCOUNTER — Ambulatory Visit (HOSPITAL_COMMUNITY): Payer: Self-pay

## 2024-09-23 LAB — CULTURE, GROUP A STREP (THRC)
